# Patient Record
Sex: Male | Born: 1965 | Race: Black or African American | Hispanic: No | Marital: Married | Smoking: Never smoker
Health system: Southern US, Community
[De-identification: ages and names within clinical notes are randomized; demographics above are authoritative.]

## PROBLEM LIST (undated history)

## (undated) DIAGNOSIS — A630 Anogenital (venereal) warts: Secondary | ICD-10-CM

## (undated) DIAGNOSIS — K648 Other hemorrhoids: Secondary | ICD-10-CM

## (undated) DIAGNOSIS — S93101A Unspecified subluxation of right toe(s), initial encounter: Secondary | ICD-10-CM

## (undated) HISTORY — DX: Unspecified subluxation of right toe(s), initial encounter: S93.101A

## (undated) HISTORY — DX: Anogenital (venereal) warts: A63.0

## (undated) HISTORY — DX: Other hemorrhoids: K64.8

---

## 1998-02-24 ENCOUNTER — Emergency Department (HOSPITAL_COMMUNITY): Admission: EM | Admit: 1998-02-24 | Discharge: 1998-02-24 | Payer: Self-pay | Admitting: Emergency Medicine

## 2001-08-01 ENCOUNTER — Emergency Department (HOSPITAL_COMMUNITY): Admission: EM | Admit: 2001-08-01 | Discharge: 2001-08-01 | Payer: Self-pay | Admitting: *Deleted

## 2004-04-19 ENCOUNTER — Ambulatory Visit (HOSPITAL_COMMUNITY): Admission: RE | Admit: 2004-04-19 | Discharge: 2004-04-19 | Payer: Self-pay | Admitting: Family Medicine

## 2006-11-27 ENCOUNTER — Ambulatory Visit: Payer: Self-pay | Admitting: Family Medicine

## 2006-12-13 ENCOUNTER — Ambulatory Visit: Payer: Self-pay | Admitting: *Deleted

## 2007-07-10 DIAGNOSIS — Z8719 Personal history of other diseases of the digestive system: Secondary | ICD-10-CM

## 2007-07-10 DIAGNOSIS — A630 Anogenital (venereal) warts: Secondary | ICD-10-CM

## 2007-07-18 ENCOUNTER — Telehealth (INDEPENDENT_AMBULATORY_CARE_PROVIDER_SITE_OTHER): Payer: Self-pay | Admitting: *Deleted

## 2007-07-18 ENCOUNTER — Encounter (INDEPENDENT_AMBULATORY_CARE_PROVIDER_SITE_OTHER): Payer: Self-pay | Admitting: *Deleted

## 2007-07-25 ENCOUNTER — Ambulatory Visit: Payer: Self-pay | Admitting: Family Medicine

## 2007-07-25 DIAGNOSIS — M21619 Bunion of unspecified foot: Secondary | ICD-10-CM

## 2007-08-02 ENCOUNTER — Encounter (INDEPENDENT_AMBULATORY_CARE_PROVIDER_SITE_OTHER): Payer: Self-pay | Admitting: *Deleted

## 2008-01-03 ENCOUNTER — Ambulatory Visit (HOSPITAL_COMMUNITY): Admission: RE | Admit: 2008-01-03 | Discharge: 2008-01-03 | Payer: Self-pay | Admitting: Family Medicine

## 2008-01-10 ENCOUNTER — Telehealth (INDEPENDENT_AMBULATORY_CARE_PROVIDER_SITE_OTHER): Payer: Self-pay | Admitting: *Deleted

## 2008-01-11 ENCOUNTER — Ambulatory Visit: Payer: Self-pay | Admitting: Internal Medicine

## 2008-01-17 ENCOUNTER — Encounter: Admission: RE | Admit: 2008-01-17 | Discharge: 2008-01-17 | Payer: Self-pay | Admitting: Internal Medicine

## 2010-11-21 ENCOUNTER — Encounter: Payer: Self-pay | Admitting: Internal Medicine

## 2012-04-23 ENCOUNTER — Encounter: Payer: Self-pay | Admitting: Family Medicine

## 2012-04-23 ENCOUNTER — Ambulatory Visit (INDEPENDENT_AMBULATORY_CARE_PROVIDER_SITE_OTHER): Payer: Self-pay | Admitting: Family Medicine

## 2012-04-23 VITALS — BP 116/74 | HR 57 | Ht 69.5 in | Wt 186.0 lb

## 2012-04-23 DIAGNOSIS — M25559 Pain in unspecified hip: Secondary | ICD-10-CM

## 2012-04-23 DIAGNOSIS — S93103A Unspecified subluxation of unspecified toe(s), initial encounter: Secondary | ICD-10-CM | POA: Insufficient documentation

## 2012-04-23 MED ORDER — MELOXICAM 15 MG PO TABS: 15.0000 mg | ORAL_TABLET | Freq: Every day | ORAL | Status: AC

## 2012-04-23 NOTE — Patient Instructions (Addendum)
Thank you for coming in today, it was nice to meet you I am going to give you a prescription for a medication to help with inflammation.  Do not take ibuprofen, aleve, goody/bc powders with this I want to get an X-ray of your hip. Be sure to ice after every practice and game Stretch before and after each practice and game. I will send a referral over to sports medicine for you to be seen there.

## 2012-04-30 NOTE — Progress Notes (Signed)
  Subjective:    Patient ID: Adam Grimes, male    DOB: 1966-01-12, 46 y.o.   MRN: [id number]  HPI Here as new patient  1. Hip pain:  Patient with R hip pain off and on over the past year.  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  States that he needs to get this problem fixed because he is playing with President Obama in a few months.  Describes pain as sharp pain that is worse after he finishes playing or if he plays for a long time.  Pain located on upper lateral aspect of hip area.  Denies snapping/popping of hip, pain at rest.  2. Toe pain:  Has had problem with 5th toe on R foot, "popping in and out" over the past few years.  He brings previous x-rays with him today.  Has been told in the past that he may need surgery.  Thinks his hip pain may be from trying to compensate for his toe problem.    Review of Systems     Objective:   Physical Exam  Constitutional: He appears well-developed and well-nourished. No distress.  Musculoskeletal: He exhibits no edema.       5th toe of R foot with chronic subluxation, easily reducible but subluxes right back.  No tenderness along metatarsal.  Strength 5/5 with inversion/eversion.    Hip:  FROM.  Strength: Flexion 5/5, abduction/adduction 5/5.  No tenderness along greater trochanter.  Pain not reproducible with palpation.            Assessment & Plan:

## 2012-04-30 NOTE — Assessment & Plan Note (Addendum)
Chronic subluxation.  Xrays reviewed.  Advised that ultimate treatment would be surgery but he would lose mobility in that toe.  I think that he would benefit from orthotics or sport insoles with buildups to better support that side of the foot, will refer to sports medicine.

## 2012-04-30 NOTE — Assessment & Plan Note (Addendum)
Likely related to muscle overuse/strain.  Advised proper stretching before and after and icing after practice/game.  Will get films of hip to assess for possible stress fx.  Does not appear to be bursitis. Mobic for pain control for now.  Will also get a referral to SM.

## 2012-05-01 ENCOUNTER — Encounter: Payer: Self-pay | Admitting: Family Medicine

## 2012-10-29 ENCOUNTER — Ambulatory Visit: Payer: Self-pay | Admitting: Family Medicine

## 2013-02-11 ENCOUNTER — Ambulatory Visit (HOSPITAL_COMMUNITY)
Admission: RE | Admit: 2013-02-11 | Discharge: 2013-02-11 | Disposition: A | Discharge status: Home / Self Care | Payer: Self-pay | Source: Ambulatory Visit | Attending: Family Medicine | Admitting: Family Medicine

## 2013-02-11 ENCOUNTER — Telehealth: Payer: Self-pay | Admitting: Family Medicine

## 2013-02-11 DIAGNOSIS — M25559 Pain in unspecified hip: Secondary | ICD-10-CM

## 2013-02-11 DIAGNOSIS — M169 Osteoarthritis of hip, unspecified: Secondary | ICD-10-CM | POA: Insufficient documentation

## 2013-02-11 DIAGNOSIS — M1610 Unilateral primary osteoarthritis, unspecified hip: Secondary | ICD-10-CM | POA: Insufficient documentation

## 2013-02-11 NOTE — Telephone Encounter (Signed)
Informed pt that he can go over to hospital to get xray of hip and then can call sports medication and make appt at his convenience.  Pt is aware and will get this done.  States that he is in season and his hip is starting to hurt again.

## 2013-02-11 NOTE — Telephone Encounter (Signed)
Patient was last seen by Dr. Ashley Royalty June of last year.  He is calling because he was supposed to have a referral to Sports Medicine and he was supposed to have an order for an Xray.  He has some paperwork but he didn't know where he was supposed to go or when he was supposed to go for any of this.

## 2013-12-03 ENCOUNTER — Ambulatory Visit (INDEPENDENT_AMBULATORY_CARE_PROVIDER_SITE_OTHER): Payer: No Typology Code available for payment source | Admitting: Podiatry

## 2013-12-03 ENCOUNTER — Encounter: Payer: Self-pay | Admitting: Podiatry

## 2013-12-03 ENCOUNTER — Ambulatory Visit: Payer: No Typology Code available for payment source

## 2013-12-03 VITALS — BP 80/60 | HR 72 | Resp 16 | Ht 71.0 in | Wt 190.0 lb

## 2013-12-03 DIAGNOSIS — M79673 Pain in unspecified foot: Secondary | ICD-10-CM

## 2013-12-03 DIAGNOSIS — M79609 Pain in unspecified limb: Secondary | ICD-10-CM

## 2013-12-03 DIAGNOSIS — M7751 Other enthesopathy of right foot: Secondary | ICD-10-CM

## 2013-12-03 DIAGNOSIS — M7750 Other enthesopathy of unspecified foot: Secondary | ICD-10-CM

## 2013-12-03 NOTE — Progress Notes (Signed)
   Subjective:    Patient ID: Adam Grimes, male    DOB: [birth year], 48 y.o.   MRN: [id number]  HPI Comments: Right foot pinky toe, and calluses on the bottom of foot , extremely painful . Plantar foot 4/ 5th  Met .     Review of Systems  All other systems reviewed and are negative.       Objective:   Physical Exam: I have reviewed his past medical history medications allergies surgeries social history. Vital signs are stable he is alert and oriented x3. Pulses are strongly palpable bilateral. Neurologic sensorium is intact per Semmes-Weinstein monofilament. Deep tendon reflexes are intact bilateral muscle strength +5 over 5 dorsiflexors plantar flexors inverters everters all intrinsic musculature is intact. Orthopedic evaluation demonstrates all joints distal to the ankle have a full range of motion without crepitation. He has a dorsal medial dislocation of the fifth metatarsophalangeal joint and fifth digit of the right foot. More than likely this is secondary to a tear in the plantar plate. He also has reactive hyperkeratosis to the plantar aspect of the fourth and fifth metatarsophalangeal joint of the right foot. A soft tissue increase in density on radiograph leads [place] to believe the nonpulsatile soft tissue mass of the plantar aspect of the fifth metatarsal right foot is more than likely bursitis. He has a wide excursion angle of the fourth intermetatarsal space. The [place] a Taylor's bunion deformity. Porokeratosis are also noted plantar aspect of the foot.        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 Bursitis and capsulitis sub-fifth metatarsophalangeal joint right foot. 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  Plan we discussed the etiology pathology conservative versus surgical therapies. At this point I excised the porokeratosis plantar aspect of the right foot. I also injected Kenalog and  local anesthetic which alleviated his symptoms beneath the fifth metatarsal head of the right foot he immediately. I will followup with him in one month at which time we will discuss surgical intervention.

## 2013-12-24 ENCOUNTER — Ambulatory Visit: Payer: No Typology Code available for payment source | Admitting: Podiatry

## 2013-12-26 ENCOUNTER — Ambulatory Visit: Payer: Self-pay

## 2014-01-02 ENCOUNTER — Encounter: Payer: Self-pay | Admitting: Podiatry

## 2014-01-02 ENCOUNTER — Ambulatory Visit: Payer: Self-pay

## 2014-01-02 ENCOUNTER — Ambulatory Visit: Payer: No Typology Code available for payment source | Admitting: Podiatry

## 2014-01-02 VITALS — BP 158/94 | HR 64 | Resp 12

## 2014-01-02 DIAGNOSIS — M7750 Other enthesopathy of unspecified foot: Secondary | ICD-10-CM

## 2014-01-02 DIAGNOSIS — M79609 Pain in unspecified limb: Secondary | ICD-10-CM

## 2014-01-02 DIAGNOSIS — M216X9 Other acquired deformities of unspecified foot: Secondary | ICD-10-CM

## 2014-01-02 DIAGNOSIS — M7751 Other enthesopathy of right foot: Secondary | ICD-10-CM

## 2014-01-02 DIAGNOSIS — M79673 Pain in unspecified foot: Secondary | ICD-10-CM

## 2014-01-02 NOTE — Progress Notes (Signed)
He presents today stating that the injection that I provided him periarticularly about the fifth metatarsophalangeal joint has worked wonders. He was still like to consider surgical intervention or other conservative therapies.  Objective: Vital signs are stable he is alert and oriented x3. Pulses are palpable right foot. Mild tenderness on palpation of the fifth metatarsophalangeal joint of the right foot.  Assessment: Plantar flexed fifth metatarsal resulting in area of reactive hyperkeratosis with Taylor's bunion deformity right foot.  Plan: Discussed etiology pathology conservative versus surgical therapies at this point we will ahead and skin him for orthotics and I will followup with him once those come in.

## 2014-06-18 ENCOUNTER — Ambulatory Visit: Payer: Self-pay

## 2014-09-10 ENCOUNTER — Encounter: Payer: Self-pay | Admitting: Family Medicine

## 2014-09-10 ENCOUNTER — Ambulatory Visit (INDEPENDENT_AMBULATORY_CARE_PROVIDER_SITE_OTHER): Payer: Self-pay | Admitting: Family Medicine

## 2014-09-10 VITALS — BP 153/91 | HR 63 | Temp 98.3°F | Ht 71.0 in | Wt 188.5 lb

## 2014-09-10 DIAGNOSIS — M25551 Pain in right hip: Secondary | ICD-10-CM

## 2014-09-10 DIAGNOSIS — Z8719 Personal history of other diseases of the digestive system: Secondary | ICD-10-CM

## 2014-09-10 DIAGNOSIS — S93101D Unspecified subluxation of right toe(s), subsequent encounter: Secondary | ICD-10-CM

## 2014-09-10 DIAGNOSIS — R030 Elevated blood-pressure reading, without diagnosis of hypertension: Secondary | ICD-10-CM

## 2014-09-10 DIAGNOSIS — IMO0001 Reserved for inherently not codable concepts without codable children: Secondary | ICD-10-CM

## 2014-09-10 LAB — COMPREHENSIVE METABOLIC PANEL
ALK PHOS: 74 U/L (ref 39–117)
ALT: 18 U/L (ref 0–53)
AST: 21 U/L (ref 0–37)
Albumin: 4.2 g/dL (ref 3.5–5.2)
BILIRUBIN TOTAL: 0.9 mg/dL (ref 0.2–1.2)
BUN: 13 mg/dL (ref 6–23)
CO2: 29 meq/L (ref 19–32)
Calcium: 9.5 mg/dL (ref 8.4–10.5)
Chloride: 101 mEq/L (ref 96–112)
Creat: 1.16 mg/dL (ref 0.50–1.35)
GLUCOSE: 119 mg/dL — AB (ref 70–99)
POTASSIUM: 4 meq/L (ref 3.5–5.3)
SODIUM: 138 meq/L (ref 135–145)
Total Protein: 6.4 g/dL (ref 6.0–8.3)

## 2014-09-10 LAB — CBC
HEMATOCRIT: 36.3 % — AB (ref 39.0–52.0)
Hemoglobin: 12.6 g/dL — ABNORMAL LOW (ref 13.0–17.0)
MCH: 30.1 pg (ref 26.0–34.0)
MCHC: 34.7 g/dL (ref 30.0–36.0)
MCV: 86.6 fL (ref 78.0–100.0)
PLATELETS: 202 10*3/uL (ref 150–400)
RBC: 4.19 MIL/uL — AB (ref 4.22–5.81)
RDW: 14.1 % (ref 11.5–15.5)
WBC: 5.3 10*3/uL (ref 4.0–10.5)

## 2014-09-10 LAB — LIPID PANEL
Cholesterol: 145 mg/dL (ref 0–200)
HDL: 56 mg/dL (ref >39)
LDL Cholesterol: 70 mg/dL (ref 0–99)
TRIGLYCERIDES: 96 mg/dL (ref <150)
Total CHOL/HDL Ratio: 2.6 Ratio
VLDL: 19 mg/dL (ref 0–40)

## 2014-09-10 NOTE — Patient Instructions (Signed)
Dear Adam Grimes, Thank you for coming in to clinic today.  1. For your Foot pain - please call Dr. Stephenie Acres office and discuss plans to follow-up and schedule surgery as needed. 2. For your Hip pain - you will be called by Sports Medicine with an appointment. Your X-ray shows mild Arthritis. Recommend stretching before activity, ice after activity, and heat as needed. May try Tylenol or Advil as tolerated. 3. Checked blood work today - to discuss results at future appointment.  Some important numbers from today's visit: BP - 153/91 - elevated BP, we will check again in the future. Please go to a [facility] or drug store and check your BP and bring me the readings.  Please schedule a follow-up appointment with Dr. Parks Ranger in 1-3 months, for follow-up.  If you have any other questions or concerns, please feel free to call the clinic to contact me. You may also schedule an earlier appointment if necessary.  However, if your symptoms get significantly worse, please go to the Emergency Department to seek immediate medical attention.  Adam Grimes, [place]

## 2014-09-10 NOTE — Assessment & Plan Note (Signed)
Hx prior rectal bleeding due to ASA - Last work-up double barium enema (negative, 03/2004) - reportedly tolerates Ibuprofen  Plan: 1. Order CBC 2. Monitor with Ibuprofen / Tylenol use 3. RTC if significant worsening

## 2014-09-10 NOTE — Assessment & Plan Note (Addendum)
Elevated BP, previous similarly elevated BP >150/90 in 12/2013 - No prior anti-HTN treatment, +family hx  Improved BP on re-check 144/84  Plan: 1. No treatment at this time 2. Advised monitor BP outpatient, reduce risk factors, smoking cessation, remain active, dietary dec Na and inc K vegetables 3. Check CMET 4. RTC 1 month, re-check BP, consider future anti-HTN agent if remains elevated

## 2014-09-10 NOTE — Assessment & Plan Note (Signed)
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 Milinda Pointer, Podiatry - improved s/p injection, previously recommended surgery, no recent f/u - Last X-ray 12/2013  Plan: 1. Recommend contacting Podiatry office to discuss previous surgical plans, and notify [facility] if repeat referral needed 2. Continue conservative therapy, may take NSAIDs PRN, relative rest

## 2014-09-10 NOTE — Progress Notes (Signed)
   Subjective:    Patient ID: Adam Grimes, male    DOB: May 29, 1966, 48 y.o.   MRN: [id number]  Patient presents for annual physical.  HPI  Right Foot pain, chronic: - History of initial sports related injury 2006, intermittent course with pain since injury. Previously followed by Podiatry [facility]), Dr. Milinda Pointer 12/2013 and 12/2013 had injection with significant improvement (up to 1 month), other work-up including X-ray (12/2013) showing dislocation of 5th MTP joint with possible plantar plate rupture. - Worse with prolonged activity, playing basketball - Does not take any medicines for this problem, no other conservative therapy tried - Previously referred to [facility], however has not followed-up. Hx of no ins prior, however now has [employer] - Denies swelling, redness, worsening pain, numbness, tingling, weakness  Right Hip Pain, chronic: - Chronic problem for about 3 years, suspects related to compensating due to Right foot pain - Describes sharp pain radiating down Right leg, intermittently - Occasionally takes Advil prior to basketball games, with good relief - Last Imaging R-hip (01/2013) - mild osteoarthritis  Hx Rectal Bleeding - Reported to have episodes of rectal bleeding after ASA use. 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 Normal work of breathing. Abd - soft, NTND, no masses, +active BS MSK - Right Hip - no significant tenderness on palpation or compression, active ROM intact Ext - Right Foot: plantar and lateral aspect mildly tender to palpation, no mid-foot or ankle tenderness, no edema or erythema, non-tender, no edema, peripheral pulses intact +2 b/l Neuro - awake, alert, oriented, grossly non-focal, intact muscle strength 5/5 b/l grip, ankles, toes, intact distal sensation to light touch, gait normal    Assessment & Plan:   See specific A&P problem list for details.

## 2014-09-10 NOTE — Assessment & Plan Note (Signed)
Chronic recurrent Right hip pain, suspected due to MSK injury from overuse and compensation due to Right foot pain - Previously referred to [facility] without follow-up - Inadequate conservative treatment  Plan: 1. Recommend NSAIDs, ice/heat PRN, stretching, relative rest 2. Referral to [facility]

## 2014-09-11 ENCOUNTER — Encounter: Payer: Self-pay | Admitting: Family Medicine

## 2014-09-22 ENCOUNTER — Ambulatory Visit: Payer: Self-pay | Admitting: Sports Medicine

## 2014-10-07 ENCOUNTER — Other Ambulatory Visit: Payer: Self-pay | Admitting: Podiatry

## 2014-10-07 ENCOUNTER — Ambulatory Visit (INDEPENDENT_AMBULATORY_CARE_PROVIDER_SITE_OTHER): Payer: Self-pay

## 2014-10-07 ENCOUNTER — Encounter: Payer: Self-pay | Admitting: Podiatry

## 2014-10-07 ENCOUNTER — Ambulatory Visit (INDEPENDENT_AMBULATORY_CARE_PROVIDER_SITE_OTHER): Payer: Self-pay | Admitting: Podiatry

## 2014-10-07 VITALS — BP 138/90 | HR 63 | Resp 18

## 2014-10-07 DIAGNOSIS — M778 Other enthesopathies, not elsewhere classified: Secondary | ICD-10-CM

## 2014-10-07 DIAGNOSIS — M779 Enthesopathy, unspecified: Secondary | ICD-10-CM

## 2014-10-07 DIAGNOSIS — M21621 Bunionette of right foot: Secondary | ICD-10-CM

## 2014-10-07 DIAGNOSIS — M7752 Other enthesopathy of left foot: Secondary | ICD-10-CM

## 2014-10-07 DIAGNOSIS — M205X1 Other deformities of toe(s) (acquired), right foot: Secondary | ICD-10-CM

## 2014-10-07 DIAGNOSIS — S82402A Unspecified fracture of shaft of left fibula, initial encounter for closed fracture: Secondary | ICD-10-CM

## 2014-10-07 NOTE — Progress Notes (Signed)
Adam Grimes presents today for a discussion regarding his painful fifth metatarsophalangeal joint of his right foot is also complaining of pain in his left ankle. States that the pain to the left ankle has started several weeks ago and seems to be getting worse with swelling particularly she's been ambulating. He also states that his right foot along the fifth metatarsophalangeal joint area is ever increasingly becoming more more painful limiting him from his activities and is daily duties. He denies any further trauma to the area. No change in his past medical history medications allergies surgeries or social history.  Objective: Vital signs are stable he is alert and oriented 3. Pulses are strongly palpable bilateral. He has pain on palpation left ankle. Radiographic evaluation demonstrates a hairline fracture of the fibular malleolus soft tissue margins appear to be normal. He has a painful fifth metatarsophalangeal joint and hammertoe deformity that is present that is result of moderate to severe trauma with dislocation of the toe. I reviewed the radiographs today demonstrate a tailor's bunion deformity and dorsally dislocated fifth toe.  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 Fractured lateral malleolus left foot. Nondisplaced non-comminuted and healing.  Plan: Continue to wrap the left foot and stay off of it as much as possible. The right foot was discussed today regarding a surgical consult which consists of a fifth metatarsal osteotomy and hammertoe repair with possible pin and a skin plasty. I went over the consent form today with him line by line and number number getting him ample time to ask questions his office regarding fifth metatarsal osteotomy with screw hammertoe repair with pin and the skin plasty. I answered all questions to the best of my ability in layman's terms. We did discuss the possible postop complications which may include but are not limited to  postoperative pain bleeding swelling infection need for further surgery loss of digit possibly a loss of life loss of ability to play sports. He understands this and is amenable to it. He signed all 3 pages of the consent form and I will follow-up with him in the near future for surgical intervention.

## 2014-10-07 NOTE — Patient Instructions (Signed)
Pre-Operative Instructions  Congratulations, you have decided to take an important step to improving your quality of life.  You can be assured that the doctors of [facility] will be with you every step of the way.  1. Plan to be at the surgery center/hospital at least 1 (one) hour prior to your scheduled time unless otherwise directed by the surgical center/hospital staff.  You must have a responsible adult accompany you, remain during the surgery and drive you home.  Make sure you have directions to the surgical center/hospital and know how to get there on time. 2. For hospital based surgery you will need to obtain a history and physical form from your family physician within 1 month prior to the date of surgery- we will give you a form for you primary physician.  3. We make every effort to accommodate the date you request for surgery.  There are however, times where surgery dates or times have to be moved.  We will contact you as soon as possible if a change in schedule is required.   4. No Aspirin/Ibuprofen for one week before surgery.  If you are on aspirin, any non-steroidal anti-inflammatory medications (Mobic, Aleve, Ibuprofen) you should stop taking it 7 days prior to your surgery.  You make take Tylenol  For pain prior to surgery.  5. Medications- If you are taking daily heart and blood pressure medications, seizure, reflux, allergy, asthma, anxiety, pain or diabetes medications, make sure the surgery center/hospital is aware before the day of surgery so they may notify you which medications to take or avoid the day of surgery. 6. No food or drink after midnight the night before surgery unless directed otherwise by surgical center/hospital staff. 7. No alcoholic beverages 24 hours prior to surgery.  No smoking 24 hours prior to or 24 hours after surgery. 8. Wear loose pants or shorts- loose enough to fit over bandages, boots, and casts. 9. No slip on shoes, sneakers are best. 10. Bring  your boot with you to the surgery center/hospital.  Also bring crutches or a walker if your physician has prescribed it for you.  If you do not have this equipment, it will be provided for you after surgery. 11. If you have not been contracted by the surgery center/hospital by the day before your surgery, call to confirm the date and time of your surgery. 12. Leave-time from work may vary depending on the type of surgery you have.  Appropriate arrangements should be made prior to surgery with your employer. 13. Prescriptions will be provided immediately following surgery by your doctor.  Have these filled as soon as possible after surgery and take the medication as directed. 14. Remove nail polish on the operative foot. 15. Wash the night before surgery.  The night before surgery wash the foot and leg well with the antibacterial soap provided and water paying special attention to beneath the toenails and in between the toes.  Rinse thoroughly with water and dry well with a towel.  Perform this wash unless told not to do so by your physician.  Enclosed: 1 Ice pack (please put in freezer the night before surgery)   1 Hibiclens skin cleaner   Pre-op Instructions  If you have any questions regarding the instructions, do not hesitate to call our office.  [place]: [address] [place], [place] [address] [phone]  [place]: [address] [place], [place] [address] [phone]  [place]: [address]  [place], [place] [address] [phone]  Dr. Richard   Tuchman DPM, Dr. Norman Regal DPM Dr. Richard Sikora DPM, Dr. M. Todd Hyatt DPM, Dr. Kathryn Egerton DPM 

## 2014-10-14 ENCOUNTER — Telehealth: Payer: Self-pay | Admitting: *Deleted

## 2014-10-14 NOTE — Telephone Encounter (Signed)
I called the patient back.  I informed him we are going to have to cancel the surgery at [facility] because they are not part of the [facility] and financial arrangement does not apply there.  Dr. Milinda Pointer only does surgeries at [facility], he doesn't do them at [facility].  He wants to refer you to Dr. Jacqualyn Posey to do the procedure.  "I don't want another doctor doing my procedure.  I want Dr. Milinda Pointer, I know of his work, he did work on my son's feet.  I don't know anything about this other doctor."  Dr. Milinda Pointer would not recommend Dr. Jacqualyn Posey if he felt he was not capable of doing a good job.  He wants you to come in for an appointment to have a consultation with Dr. Jacqualyn Posey.  If you choose to have the procedure done by Dr. Milinda Pointer, you will be responsible for the facility fee as well as anesthesia out of your pocket.  The charge for Dr. Milinda Pointer is $2,157.50.  That will have to be paid prior to surgery.  Would you like to schedule an appointment with Dr. Jacqualyn Posey?  "Go ahead and transfer me to a scheduler.  Jocelyn Lamer spoke to the patient about his estimate and what is expected of him prior to surgery.  She also explained to him that the facility fee and anesthesia fee may be expected prior to surgery as well.  It could cost up to $6000 out of pocket.

## 2014-10-14 NOTE — Telephone Encounter (Signed)
Pt called states he has surgery scheduled 10/20/2014, and no other message.

## 2014-10-14 NOTE — Telephone Encounter (Signed)
I called and left the patient a message to call me in regards to surgery cancellation for 10/20/2014.  I need to inform him that we will have to reschedule surgery to [facility], [facility] is not a part of [facility] so 50% discount will not apply.  Dr. Milinda Pointer doesn't do surgeries there so we are going to refer him to Dr. Jacqualyn Posey.  He will have to schedule an appointment for a consultation with Dr. Jacqualyn Posey. He will have to pay 50% up front before surgery date.  That cost per Jocelyn Lamer will be $2,157.50.

## 2016-02-17 DIAGNOSIS — Z021 Encounter for pre-employment examination: Secondary | ICD-10-CM | POA: Insufficient documentation

## 2016-02-17 DIAGNOSIS — Z7689 Persons encountering health services in other specified circumstances: Secondary | ICD-10-CM | POA: Insufficient documentation

## 2016-09-13 ENCOUNTER — Ambulatory Visit (INDEPENDENT_AMBULATORY_CARE_PROVIDER_SITE_OTHER): Payer: Managed Care, Other (non HMO) | Admitting: Podiatry

## 2016-09-13 ENCOUNTER — Ambulatory Visit: Payer: Managed Care, Other (non HMO)

## 2016-09-13 ENCOUNTER — Ambulatory Visit (INDEPENDENT_AMBULATORY_CARE_PROVIDER_SITE_OTHER): Payer: Managed Care, Other (non HMO)

## 2016-09-13 ENCOUNTER — Telehealth: Payer: Self-pay | Admitting: *Deleted

## 2016-09-13 ENCOUNTER — Encounter: Payer: Self-pay | Admitting: Podiatry

## 2016-09-13 ENCOUNTER — Encounter: Payer: Self-pay | Admitting: *Deleted

## 2016-09-13 DIAGNOSIS — M79672 Pain in left foot: Secondary | ICD-10-CM

## 2016-09-13 DIAGNOSIS — M21621 Bunionette of right foot: Secondary | ICD-10-CM

## 2016-09-13 DIAGNOSIS — Q828 Other specified congenital malformations of skin: Secondary | ICD-10-CM | POA: Diagnosis not present

## 2016-09-13 DIAGNOSIS — M7752 Other enthesopathy of left foot: Secondary | ICD-10-CM

## 2016-09-13 DIAGNOSIS — M6740 Ganglion, unspecified site: Secondary | ICD-10-CM | POA: Diagnosis not present

## 2016-09-13 NOTE — Progress Notes (Signed)
He presents today chief complaint of painful fifth metatarsophalangeal joint dislocation of the toe right foot he states that this toe still bothers me and I think him ready to have something done about it he is also concerned about 2 nodules that have popped up on the anterior and anterolateral ankle left he states the base, but your ago and he's noticed that they still swell and get larger and more painful and go down.  Objective: Vital signs are stable he is alert and oriented 3. Pulses are palpable. Neurologic system is intact to tendon reflexes are intact muscle strength is normal bilateral. He has 2 large nonpulsatile masses is set on the lateral aspect of the ankle overlying subtalar joint and the anterior medial ankle left. These appear to be ganglion cysts and no calcification is noted on radiography. Right foot does demonstrate complete dislocation of the fifth toe on the right foot with a reactive hyperkeratosis sub-fifth metatarsal head. The fifth toes completely dislocated and sitting dorsally.  Assessment: Dislocation and prominent fifth metatarsal head of the right foot. Painful porokeratosis to the plantar aspect of the fourth metatarsal right foot. 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 Once our report has come back for this and we will consider an a consent form for a fifth metatarsal head resection and excision of soft tissue lesion plantar fourth. He can only be out of work for maximum 3 weeks.

## 2016-09-24 ENCOUNTER — Inpatient Hospital Stay: Admission: RE | Admit: 2016-09-24 | Payer: Self-pay | Source: Ambulatory Visit

## 2016-10-14 NOTE — Telephone Encounter (Addendum)
Pt states he needs to reschedule his MRI. Left message informing pt he could call [facility] and reschedule his appt and if he had concerns with insurance to call our office again. 11/14/2016-Received fax request for Lidocaine 5% apply 2 grams upt to 4 times daily to affected areas +3 refills. 11/15/2016-Dr. Hyatt okayed Lidocaine 5% as on fax form +3refills

## 2016-10-28 ENCOUNTER — Ambulatory Visit
Admission: RE | Admit: 2016-10-28 | Discharge: 2016-10-28 | Disposition: A | Discharge status: Home / Self Care | Payer: Managed Care, Other (non HMO) | Source: Ambulatory Visit | Attending: Podiatry | Admitting: Podiatry

## 2016-10-28 DIAGNOSIS — M6740 Ganglion, unspecified site: Secondary | ICD-10-CM

## 2016-10-28 MED ORDER — GADOBENATE DIMEGLUMINE 529 MG/ML IV SOLN
18.0000 mL | Freq: Once | INTRAVENOUS | Status: AC | PRN
  Administered 2016-10-28: 18 mL via INTRAVENOUS

## 2016-11-08 ENCOUNTER — Encounter: Payer: Self-pay | Admitting: Podiatry

## 2016-11-08 ENCOUNTER — Ambulatory Visit (INDEPENDENT_AMBULATORY_CARE_PROVIDER_SITE_OTHER): Payer: Managed Care, Other (non HMO) | Admitting: Podiatry

## 2016-11-08 DIAGNOSIS — M21621 Bunionette of right foot: Secondary | ICD-10-CM | POA: Diagnosis not present

## 2016-11-08 DIAGNOSIS — Q828 Other specified congenital malformations of skin: Secondary | ICD-10-CM | POA: Diagnosis not present

## 2016-11-08 NOTE — Patient Instructions (Signed)
Pre-Operative Instructions  Congratulations, you have decided to take an important step to improving your quality of life.  You can be assured that the doctors of [facility] will be with you every step of the way.  1. Plan to be at the surgery center/hospital at least 1 (one) hour prior to your scheduled time unless otherwise directed by the surgical center/hospital staff.  You must have a responsible adult accompany you, remain during the surgery and drive you home.  Make sure you have directions to the surgical center/hospital and know how to get there on time. 2. For hospital based surgery you will need to obtain a history and physical form from your family physician within 1 month prior to the date of surgery- we will give you a form for you primary physician.  3. We make every effort to accommodate the date you request for surgery.  There are however, times where surgery dates or times have to be moved.  We will contact you as soon as possible if a change in schedule is required.   4. No Aspirin/Ibuprofen for one week before surgery.  If you are on aspirin, any non-steroidal anti-inflammatory medications (Mobic, Aleve, Ibuprofen) you should stop taking it 7 days prior to your surgery.  You make take Tylenol  For pain prior to surgery.  5. Medications- If you are taking daily heart and blood pressure medications, seizure, reflux, allergy, asthma, anxiety, pain or diabetes medications, make sure the surgery center/hospital is aware before the day of surgery so they may notify you which medications to take or avoid the day of surgery. 6. No food or drink after midnight the night before surgery unless directed otherwise by surgical center/hospital staff. 7. No alcoholic beverages 24 hours prior to surgery.  No smoking 24 hours prior to or 24 hours after surgery. 8. Wear loose pants or shorts- loose enough to fit over bandages, boots, and casts. 9. No slip on shoes, sneakers are best. 10. Bring  your boot with you to the surgery center/hospital.  Also bring crutches or a walker if your physician has prescribed it for you.  If you do not have this equipment, it will be provided for you after surgery. 11. If you have not been contracted by the surgery center/hospital by the day before your surgery, call to confirm the date and time of your surgery. 12. Leave-time from work may vary depending on the type of surgery you have.  Appropriate arrangements should be made prior to surgery with your employer. 13. Prescriptions will be provided immediately following surgery by your doctor.  Have these filled as soon as possible after surgery and take the medication as directed. 14. Remove nail polish on the operative foot. 15. Wash the night before surgery.  The night before surgery wash the foot and leg well with the antibacterial soap provided and water paying special attention to beneath the toenails and in between the toes.  Rinse thoroughly with water and dry well with a towel.  Perform this wash unless told not to do so by your physician.  Enclosed: 1 Ice pack (please put in freezer the night before surgery)   1 Hibiclens skin cleaner   Pre-op Instructions  If you have any questions regarding the instructions, do not hesitate to call our office.  [place]: [address] [place], [place] [address] [phone]  [place]: [address] [place], [place] [address] [phone]  [place]: [address]  [place], [place] [address] [phone]   Dr.   Norman Regal DPM, Dr. Matthew Wagoner DPM, Dr. M. Todd Blakely Maranan DPM, Dr. Titorya Stover DPM 

## 2016-11-08 NOTE — Progress Notes (Signed)
He presents today sinus consent for his right foot. His MRI is back for his left ankle. He states that his left ankle has absolutely no pain whatsoever and really does not want to do anything to the left foot at all he would like his right fifth metatarsal area.  Objective: Vital signs are stable alert and oriented 3. Pulses are palpable. Neurologic sensorium is intact. Palpable mass left ankle MRI relates ganglion cyst. Right foot demonstrates dislocated fifth metatarsophalangeal joint and a painful lesion sub-fourth metatarsal phalangeal joint and a contracted fourth metatarsophalangeal joint. Radiographs were reviewed today.  Assessment: At this point I recommended surgical resection of the head of the fifth metatarsal with excision of soft tissue lesion plantar aspect of the right foot. Release of the fourth metatarsophalangeal joint was also be necessary.  Plan: We discussed etiology and pathology conservative versus surgical therapies at this point I have recommended resection of the head of the fifth metatarsal right foot. And release of the fourth metatarsophalangeal joint with resection of soft tissue lesion. He understands this is uncomfortable to inside of the pages of the consent form. We did discuss the possible postoperative complications which may include but are not limited to postop pain bleeding swelling infection recurrence need further surgery overcorrection under correction loss of digit also limb loss of life continued pain. He understands this is amenable to it dispensed a cam walker today I will follow-up with him in years future for surgical intervention we discussed anesthesia and the surgery center today.

## 2016-11-14 NOTE — Telephone Encounter (Signed)
That will be fine if you want to Rx it.

## 2016-11-15 MED ORDER — LIDOCAINE 5 % EX OINT: TOPICAL_OINTMENT | CUTANEOUS | 3 refills | Status: DC

## 2017-02-21 ENCOUNTER — Telehealth: Payer: Self-pay | Admitting: *Deleted

## 2017-02-21 NOTE — Telephone Encounter (Signed)
Received request for refill of omega 3. Dr. Milinda Pointer states he did not order for pt. Return fax denied.

## 2017-09-28 ENCOUNTER — Telehealth: Payer: Self-pay | Admitting: *Deleted

## 2017-09-28 MED ORDER — LIDOCAINE 5 % EX OINT: TOPICAL_OINTMENT | CUTANEOUS | 3 refills | Status: DC

## 2017-09-28 NOTE — Telephone Encounter (Signed)
Refill request for Lidocaine 5% Ointment. Dr. Milinda Pointer states refill +3additional, pt needs to be seen prior to future refills.

## 2017-10-31 HISTORY — PX: OTHER SURGICAL HISTORY: SHX169

## 2018-08-10 IMAGING — MR MR ANKLE*L* WO/W CM
5 of 9 series · 20 of 40 positions shown · IV contrast (18ml multihance)
Comparison: Radiographs 09/13/2016

CLINICAL DATA: Palpable mass along the dorsum of the ankle for
approximately 4 months.

EXAM:
MRI OF THE LEFT ANKLE WITHOUT AND WITH CONTRAST
TECHNIQUE: Multiplanar, multisequence MR imaging of the ankle was performed
before and after the administration of intravenous contrast.
CONTRAST:  18mL MULTIHANCE GADOBENATE DIMEGLUMINE 529 MG/ML IV SOLN

[Series 4: T1 · sagittal · 3.0mm · 0.27mm/px · 3 of 24 slices shown (1 of 2)]
[im 1/24]
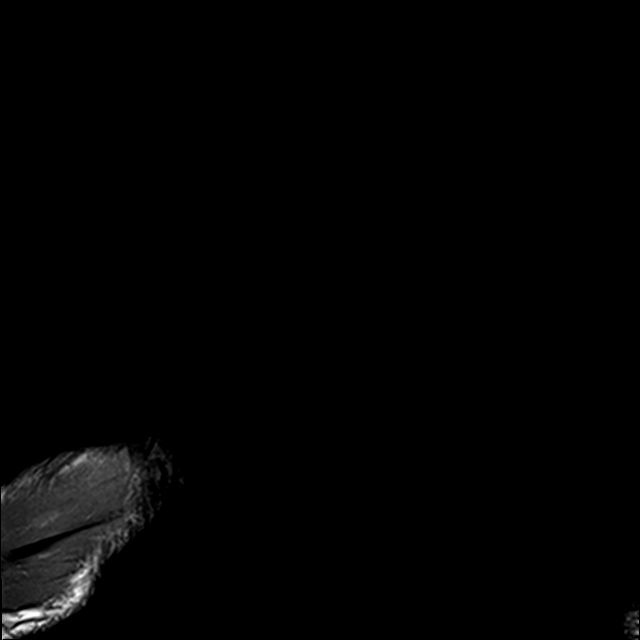
[im 12/24]
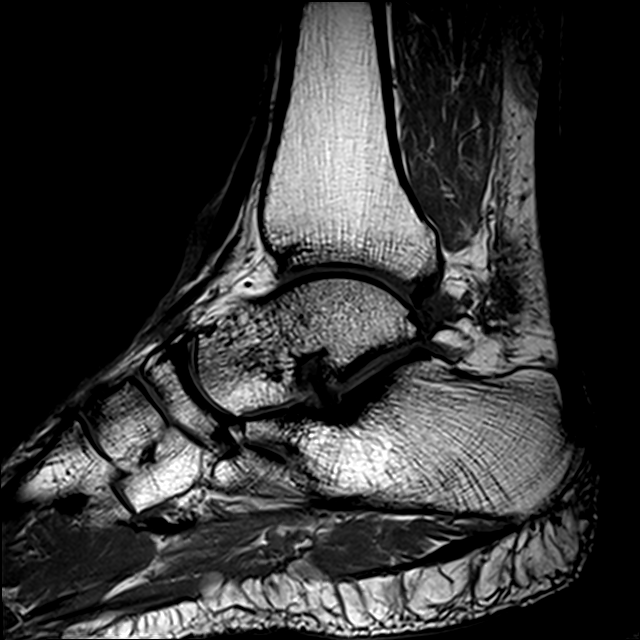
[im 24/24]
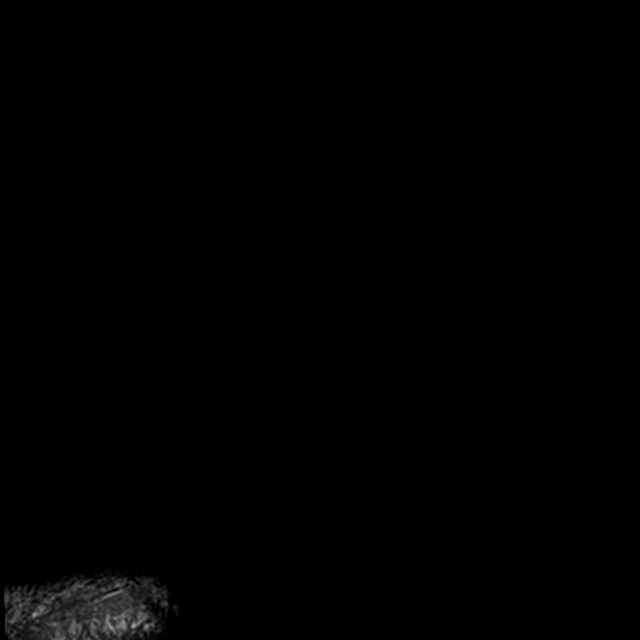

[Series 6: T2 fat-sat · axial · 4.0mm · 0.53mm/px · z∈[-37,+108]mm · 5 of 30 slices shown (1 of 2)]
[im 1/30]
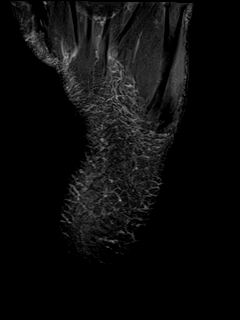
[im 8/30]
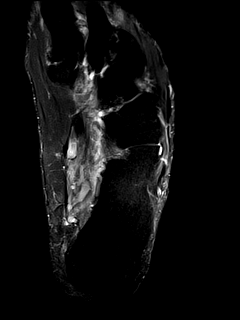
[im 15/30]
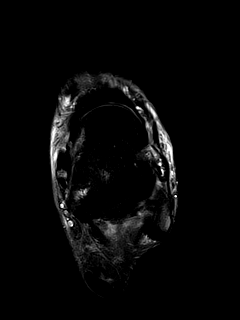
[im 22/30]
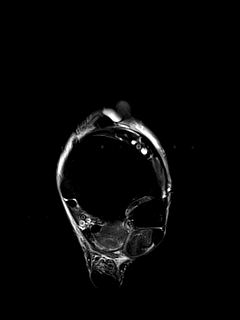
[im 30/30]
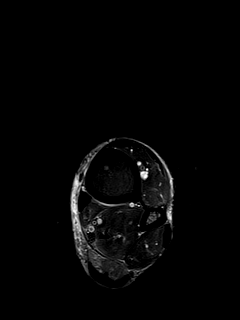

[Series 7: T2 fat-sat · coronal · 4.0mm · 0.33mm/px · 5 of 30 slices shown (2 of 2)]
[im 1/30]
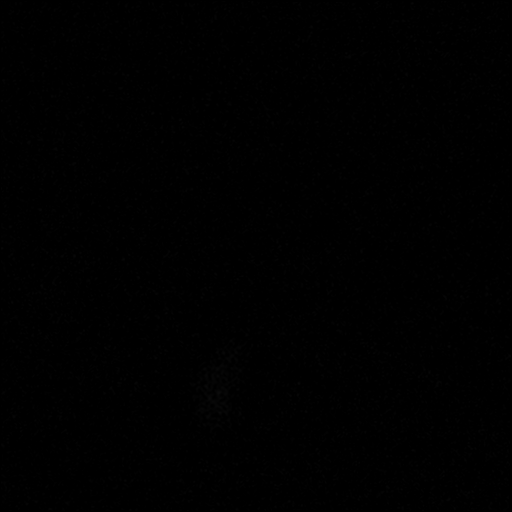
[im 8/30]
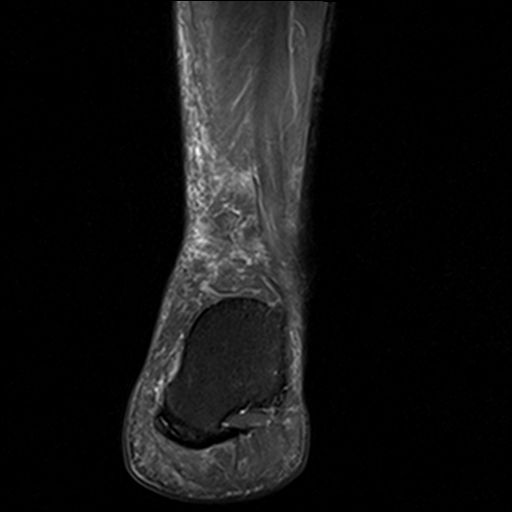
[im 15/30]
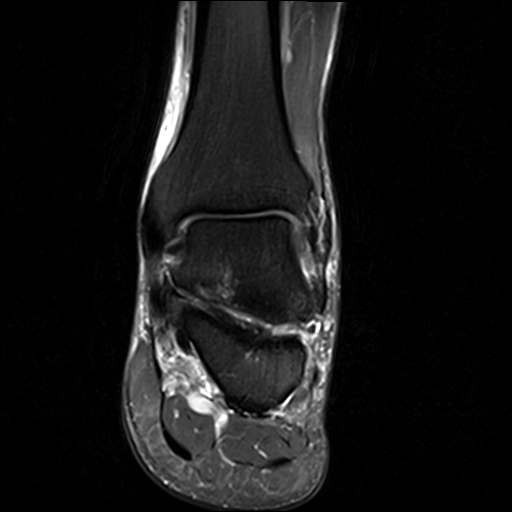
[im 22/30]
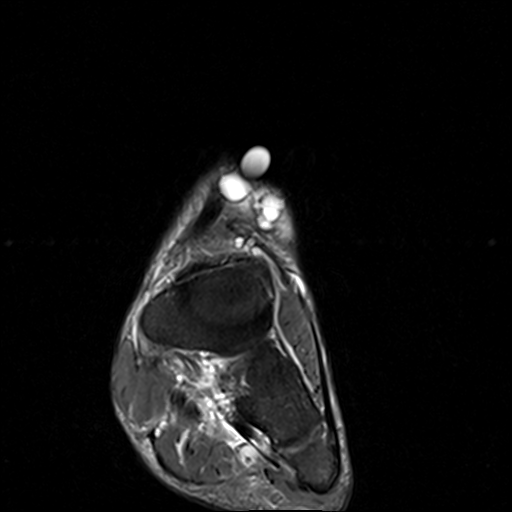
[im 30/30]
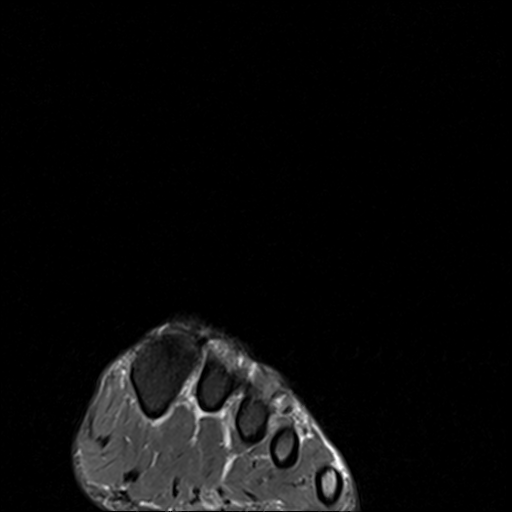

[Series 8: T1 · axial · 4.0mm · 0.27mm/px · z∈[-37,+108]mm · 5 of 30 slices shown (2 of 2)]
[im 1/30]
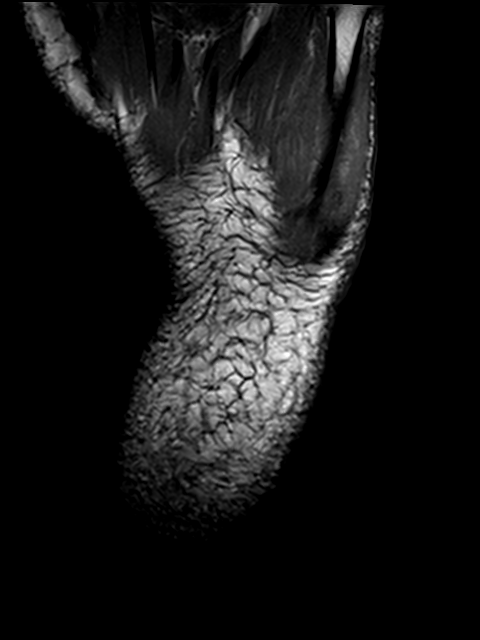
[im 8/30]
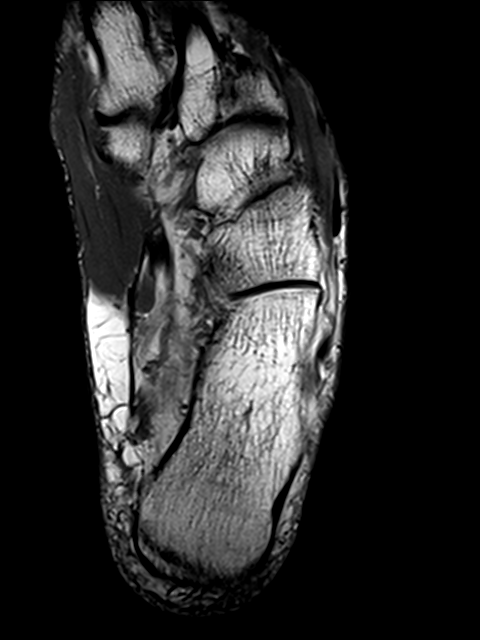
[im 15/30]
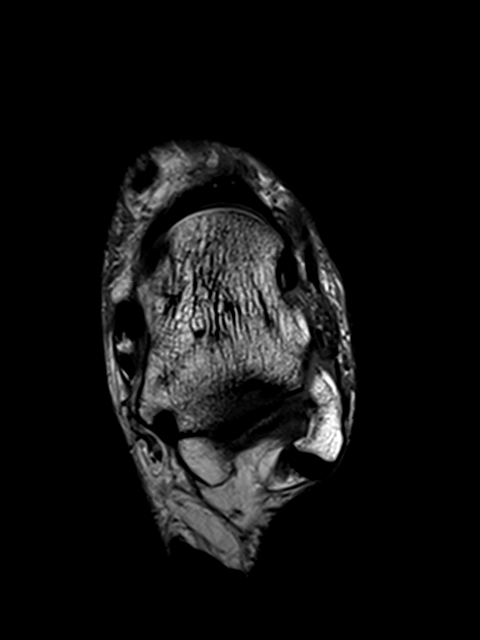
[im 22/30]
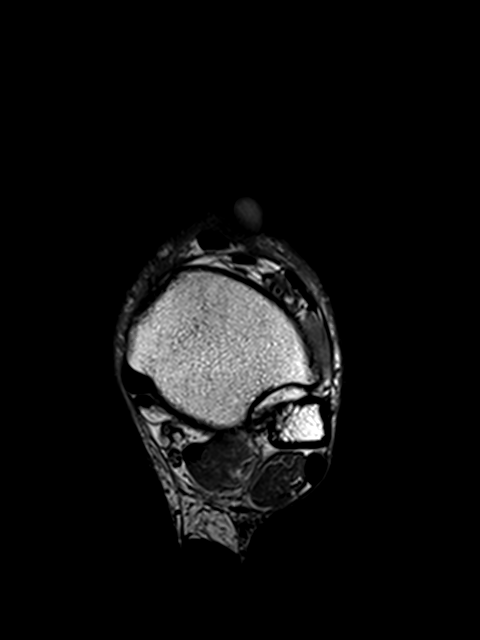
[im 30/30]
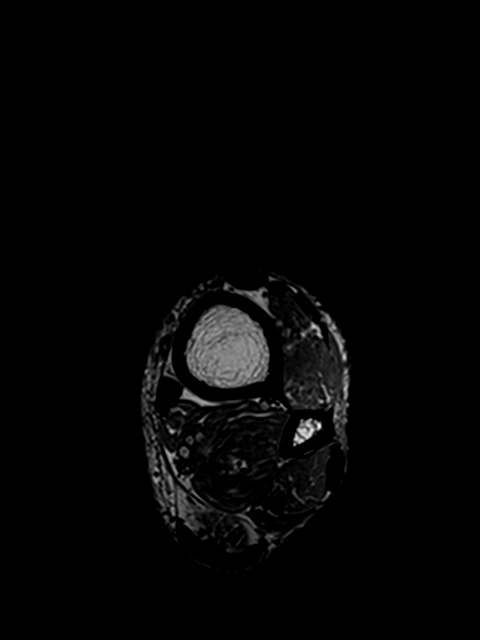

[Series 9: T1 fat-sat · axial · 4.0mm · 0.27mm/px · z∈[-37,-2]mm · 2 of 30 slices shown]
[im 1/30]
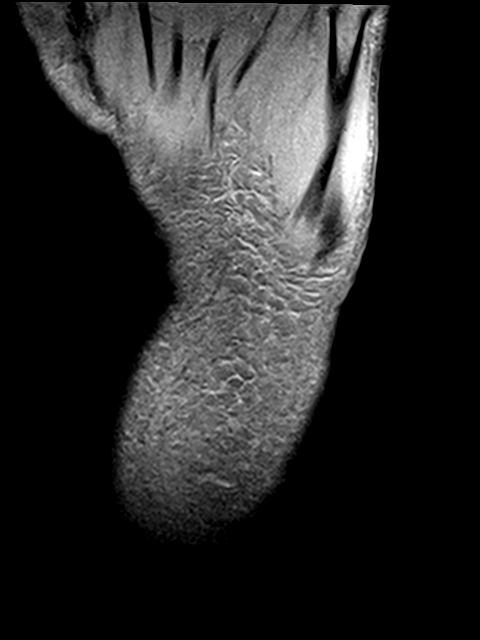
[im 8/30]
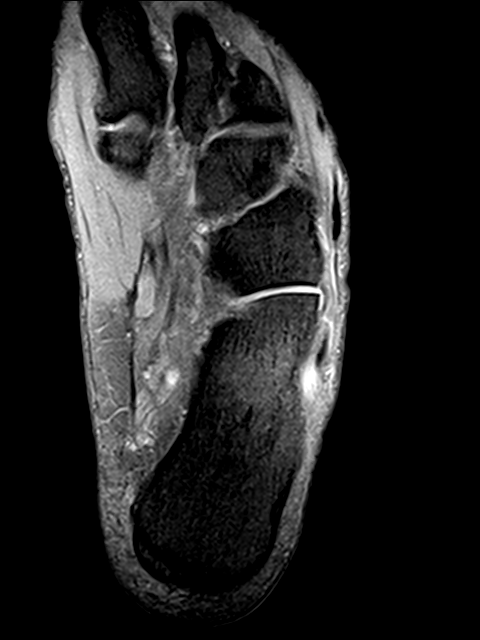

[20 of 40 positions shown; findings below may reference images not displayed]

FINDINGS: TENDONS

Peroneal: Intact.

Posteromedial: Moderate distal tendinopathy involving the posterior
tibialis tendon.

Anterior: Intact.

Achilles: Intact.

Plantar Fascia: Intact.

LIGAMENTS

Lateral: Intact.

Medial: Intact.

CARTILAGE

Ankle Joint: Mild degenerative changes with early spurring. No
cartilage defects or osteochondral lesion. No joint effusion.

Subtalar Joints/Sinus Tarsi: The subtalar joints are maintained. The
sinus tarsi is normal. The cervical and interosseous ligaments are
intact. The spring ligament is intact. Incidental os trigonum.

Bones: No stress fracture or osteochondral abnormality.

Other: The patient's palpable abnormality is marked with a vitamin-E
capsule. The scars pons to 80 multi septated cystic lesion with
typical rim-like synovial enhancement consistent with a ganglion
cyst. It is mainly located between the extensor hallucis longus
tendon and the extensor digitorum longus tendons. It measures
approximately 24 x 13 x 11 mm.
IMPRESSION: IMPRESSION
1. The patient's palpable abnormality corresponds to a ganglion
cyst. It is closely associated with the anterior ankle tendons.
2. Intact medial and lateral ankle ligaments and tendons. There is
moderate tendinopathy involving the posterior tibialis tendon.
3. Mild ankle joint degenerative changes.
4. No stress fracture or osteochondral lesions.

## 2019-04-11 ENCOUNTER — Other Ambulatory Visit: Payer: Self-pay

## 2019-04-11 ENCOUNTER — Encounter: Payer: Self-pay | Admitting: Podiatry

## 2019-04-11 ENCOUNTER — Ambulatory Visit (INDEPENDENT_AMBULATORY_CARE_PROVIDER_SITE_OTHER): Payer: PRIVATE HEALTH INSURANCE | Admitting: Podiatry

## 2019-04-11 VITALS — Temp 98.0°F

## 2019-04-11 DIAGNOSIS — B070 Plantar wart: Secondary | ICD-10-CM | POA: Diagnosis not present

## 2019-04-11 DIAGNOSIS — L989 Disorder of the skin and subcutaneous tissue, unspecified: Secondary | ICD-10-CM

## 2019-04-11 NOTE — Patient Instructions (Signed)

## 2019-04-11 NOTE — Progress Notes (Signed)
He presents today chief complaint of painful lesion sub-fourth metatarsal head of the right foot.  States that this is been there for many many years and not like to get rid of it if possible with feels like a walking on a rock.  Objective: Vital signs are stable alert oriented x3 pulses are palpable.  Evaluation of the right foot demonstrates a solitary lesion sub-fourth metatarsal right.  Appears to be verrucoid in nature.  Assessment: Verruca plantaris.  Plan: At this point surgical curettage was performed and the lesion was sent for pathologic evaluation.  He tolerated procedure well after local anesthetic was injected sub-lesional a total of 6 cc of a 50-50 mixture of Marcaine plain lidocaine plain with epi was injected.  He tolerated procedure well as this was curettage and sent for pathology.  I will follow-up with him in 2 weeks at which time we hope pathology will be back.

## 2019-04-25 ENCOUNTER — Other Ambulatory Visit: Payer: Self-pay

## 2019-04-25 ENCOUNTER — Ambulatory Visit (INDEPENDENT_AMBULATORY_CARE_PROVIDER_SITE_OTHER): Payer: No Typology Code available for payment source | Admitting: Podiatry

## 2019-04-25 ENCOUNTER — Encounter: Payer: Self-pay | Admitting: Podiatry

## 2019-04-25 DIAGNOSIS — L989 Disorder of the skin and subcutaneous tissue, unspecified: Secondary | ICD-10-CM

## 2019-04-25 DIAGNOSIS — B070 Plantar wart: Secondary | ICD-10-CM

## 2019-04-25 NOTE — Progress Notes (Signed)
He presents today for follow-up of his surgical curettage she states that he feels so much better and it seems to be healing up.  Objective: Vital signs are stable he is alert and oriented x3 status post surgical curettage sub-fourth metatarsal head of the right foot.  There is no erythema edema cellulitis drainage odor appears to be healing.  Is relatively hard on palpation.  Assessment: Well-healing surgical foot sub-fourth met head right.  Plan: I encouraged him to place Vaseline on the wound with a Band-Aid during the day but leave open at bedtime.  We have not had the pathology report as of yet we will notify him as to those results.

## 2020-01-29 ENCOUNTER — Ambulatory Visit: Payer: Self-pay | Admitting: Internal Medicine

## 2020-02-13 ENCOUNTER — Ambulatory Visit: Payer: Self-pay | Admitting: Internal Medicine

## 2020-03-03 ENCOUNTER — Ambulatory Visit: Payer: Self-pay | Admitting: Internal Medicine

## 2020-04-10 ENCOUNTER — Other Ambulatory Visit: Payer: Self-pay

## 2020-04-10 ENCOUNTER — Encounter: Payer: Self-pay | Admitting: Internal Medicine

## 2020-04-10 ENCOUNTER — Ambulatory Visit: Payer: Self-pay | Admitting: Internal Medicine

## 2020-04-10 VITALS — BP 122/70 | HR 74 | Resp 12 | Ht 69.0 in | Wt 197.0 lb

## 2020-04-10 DIAGNOSIS — N529 Male erectile dysfunction, unspecified: Secondary | ICD-10-CM

## 2020-04-10 DIAGNOSIS — Z125 Encounter for screening for malignant neoplasm of prostate: Secondary | ICD-10-CM

## 2020-04-10 DIAGNOSIS — Z8719 Personal history of other diseases of the digestive system: Secondary | ICD-10-CM

## 2020-04-10 DIAGNOSIS — A630 Anogenital (venereal) warts: Secondary | ICD-10-CM

## 2020-04-10 DIAGNOSIS — Z7689 Persons encountering health services in other specified circumstances: Secondary | ICD-10-CM

## 2020-04-10 NOTE — Progress Notes (Signed)
Subjective:    Patient ID: Adam Grimes, male   DOB: 1966-10-11, 54 y.o.   MRN: [id number]   HPI   Here to establish.  Here with wife, [name]  1.  Would like a general work up to check for chronic disease, such as diabetes, cholesterol issues, etc.  Has not had testing in some time.  2.  Blood in stool for 20 years.  This is intermittent.  BRBPR--only on outside of stool, can be in toilet water and on tissue.   Had a barium enema performed maybe in 2008 or 2009 without findings. He states later that Dr. Zadie Rhine told him he likely had an internal hemorrhoid.       3.  ED:  Sometimes loses erection in middle of intercourse--just goes away.  Also, sometimes can have extra tissue with one of his testicles.     No outpatient medications have been marked as taking for the 04/10/20 encounter (Office Visit) with Mack Hook, MD.   No Known Allergies  Past Medical History:  Diagnosis Date  . Condyloma   . Internal hemorrhoid   . Subluxation of right toe     History reviewed. No pertinent surgical history.  Family History  Problem Relation Age of Onset  . Depression Mother   . Coronary artery disease Maternal Grandfather     Social History   Socioeconomic History  . Marital status: Married    Spouse name: Adam Grimes  . Number of children: 3  . Years of education: 47  . Highest education level: Some college, no degree  Occupational History  . Occupation: Scientist, research (physical sciences) at [employer] and [employer] Use  . Smoking status: Current Every Day Smoker    Types: Cigars  . Smokeless tobacco: Never Used  . Tobacco comment: 8 cigars daily.  Vaping Use  . Vaping Use: Never used  Substance and Sexual Activity  . Alcohol use: Yes    Alcohol/week: 0.0 standard drinks    Comment: Rare  . Drug use: Yes    Comment: smokes marijuana  . Sexual activity: Yes  Other Topics Concern  . Not on file  Social History Narrative   Lives with wife and 3 children:   38, 17 and 31 yo   Social Determinants of Radio broadcast assistant Strain: Low Risk   . Difficulty of Paying Living Expenses: Not hard at all  Food Insecurity: No Food Insecurity  . Worried About Charity fundraiser in the Last Year: Never true  . Ran Out of Food in the Last Year: Never true  Transportation Needs: No Transportation Needs  . Lack of Transportation (Medical): No  . Lack of Transportation (Non-Medical): No  Physical Activity:   . Days of Exercise per Week:   . Minutes of Exercise per Session:   Stress: Stress Concern Present  . Feeling of Stress : To some extent  Social Connections: Socially Integrated  . Frequency of Communication with Friends and Family: More than three times a week  . Frequency of Social Gatherings with Friends and Family: More than three times a week  . Attends Religious Services: More than 4 times per year  . Active Member of Clubs or Organizations: Yes  . Attends Archivist Meetings: More than 4 times per year  . Marital Status: Married  Human resources officer Violence: Unknown  . Fear of Current or Ex-Partner: Not on file  . Emotionally Abused: No  . Physically Abused: No  .  Sexually Abused: No    Review of Systems    Objective:   BP 122/70 (BP Location: Left Arm, Patient Position: Sitting, Cuff Size: Normal)   Pulse 74   Resp 12   Ht 5\' 9"  (1.753 m)   Wt 197 lb (89.4 kg)   BMI 29.09 kg/m   Physical Exam  NAD HEENT:  PERRL, EOMI, TMs pearly gray, throat without injection Neck:  Supple, No adenopathy, no thyromegaly. Chest:  CTA CV:  RRR with normal S1 and S2, No S3, S4 or murmur.  No carotid bruits.  Carotid, radial and DP pulses normal and equal Abd:  S, NT, No HSM or mass, + BS. GU:  Flat warts on dorsal aspect of penis.  No sores.  No testicular mass or tenderness.  No inguinal hernia. LE:  No edema.   Assessment & Plan   1.  General health concerns:  FLP, CBC, CMP, PSA today  2.  History of BRBPR with  findings of internal hemorrhoids when followed by Dr. Zadie Rhine at [facility].  Will see what CBC shows.  If anemia, will set up for diagnostic colonoscopy and repeat guaiac cards.  Needs orange card, however.  3.  ED:  See labs first before considering treatment.  4.  Genital Warts:  Discussed can consider Podophyllin or cryotherapy.  He will consider.  5.  Overweight:  To work on diet and physical activity--develop goals to maintain health lifestyle.

## 2020-04-10 NOTE — Progress Notes (Signed)
Social worker met with new patient who is scheduled with Dr. Mulberry for medical visit. Social worker completed New Patient Questionnaire which included completion of housing, intimate partner violence, transportation needs, stress, financial resource strain, food insecurity and screeners.   Social Determinants of Health   Financial Resource Strain: Low Risk   . Difficulty of Paying Living Expenses: Not hard at all  Food Insecurity: No Food Insecurity  . Worried About Running Out of Food in the Last Year: Never true  . Ran Out of Food in the Last Year: Never true  Transportation Needs: No Transportation Needs  . Lack of Transportation (Medical): No  . Lack of Transportation (Non-Medical): No  Physical Activity:   . Days of Exercise per Week:   . Minutes of Exercise per Session:   Stress: Stress Concern Present  . Feeling of Stress : To some extent  Social Connections: Socially Integrated  . Frequency of Communication with Friends and Family: More than three times a week  . Frequency of Social Gatherings with Friends and Family: More than three times a week  . Attends Religious Services: More than 4 times per year  . Active Member of Clubs or Organizations: Yes  . Attends Club or Organization Meetings: More than 4 times per year  . Marital Status: Married      Depression screen PHQ 2/9 04/10/2020  Decreased Interest 0  Down, Depressed, Hopeless 1  PHQ - 2 Score 1  Altered sleeping 1  Tired, decreased energy 1  Change in appetite 1  Feeling bad or failure about yourself  0  Trouble concentrating 0  Moving slowly or fidgety/restless 0  Suicidal thoughts 0  PHQ-9 Score 4  Difficult doing work/chores Somewhat difficult    GAD 7 : Generalized Anxiety Score 04/10/2020  Nervous, Anxious, on Edge 0  Control/stop worrying 0  Worry too much - different things 1  Trouble relaxing 1  Restless 1  Easily annoyed or irritable 3  Afraid - awful might happen 2  Total GAD 7 Score 8   Anxiety Difficulty Somewhat difficult     Based on presentation no immediate need for recommendations. Patient shared he has attempted treatment in the past but did not find as helpful. Patient shared he has been able to manage symptoms with playing basketball and his faith. Patient displayed understanding if symptoms become unmanagable he is welcome with this LCSW. Patient accepted LCSW card.   

## 2020-04-11 LAB — COMPREHENSIVE METABOLIC PANEL
ALT: 11 IU/L (ref 0–44)
AST: 14 IU/L (ref 0–40)
Albumin/Globulin Ratio: 2.5 — ABNORMAL HIGH (ref 1.2–2.2)
Albumin: 4.7 g/dL (ref 3.8–4.9)
Alkaline Phosphatase: 88 IU/L (ref 48–121)
BUN/Creatinine Ratio: 12 (ref 9–20)
BUN: 15 mg/dL (ref 6–24)
Bilirubin Total: 0.8 mg/dL (ref 0.0–1.2)
CO2: 25 mmol/L (ref 20–29)
Calcium: 9.5 mg/dL (ref 8.7–10.2)
Chloride: 105 mmol/L (ref 96–106)
Creatinine, Ser: 1.28 mg/dL — ABNORMAL HIGH (ref 0.76–1.27)
GFR calc Af Amer: 74 mL/min/{1.73_m2} (ref >59)
GFR calc non Af Amer: 64 mL/min/{1.73_m2} (ref >59)
Globulin, Total: 1.9 g/dL (ref 1.5–4.5)
Glucose: 84 mg/dL (ref 65–99)
Potassium: 4.6 mmol/L (ref 3.5–5.2)
Sodium: 141 mmol/L (ref 134–144)
Total Protein: 6.6 g/dL (ref 6.0–8.5)

## 2020-04-11 LAB — PSA: Prostate Specific Ag, Serum: 0.2 ng/mL (ref 0.0–4.0)

## 2020-04-11 LAB — CBC WITH DIFFERENTIAL/PLATELET
Basophils Absolute: 0.1 10*3/uL (ref 0.0–0.2)
Basos: 1 %
EOS (ABSOLUTE): 0.4 10*3/uL (ref 0.0–0.4)
Eos: 5 %
Hematocrit: 38.4 % (ref 37.5–51.0)
Hemoglobin: 12.6 g/dL — ABNORMAL LOW (ref 13.0–17.7)
Immature Grans (Abs): 0 10*3/uL (ref 0.0–0.1)
Immature Granulocytes: 0 %
Lymphocytes Absolute: 2.8 10*3/uL (ref 0.7–3.1)
Lymphs: 41 %
MCH: 29.4 pg (ref 26.6–33.0)
MCHC: 32.8 g/dL (ref 31.5–35.7)
MCV: 90 fL (ref 79–97)
Monocytes Absolute: 0.4 10*3/uL (ref 0.1–0.9)
Monocytes: 6 %
Neutrophils Absolute: 3.2 10*3/uL (ref 1.4–7.0)
Neutrophils: 47 %
Platelets: 220 10*3/uL (ref 150–450)
RBC: 4.28 x10E6/uL (ref 4.14–5.80)
RDW: 13.9 % (ref 11.6–15.4)
WBC: 6.9 10*3/uL (ref 3.4–10.8)

## 2020-04-11 LAB — LIPID PANEL W/O CHOL/HDL RATIO
Cholesterol, Total: 168 mg/dL (ref 100–199)
HDL: 55 mg/dL (ref >39)
LDL Chol Calc (NIH): 96 mg/dL (ref 0–99)
Triglycerides: 90 mg/dL (ref 0–149)
VLDL Cholesterol Cal: 17 mg/dL (ref 5–40)

## 2020-05-06 ENCOUNTER — Other Ambulatory Visit: Payer: Self-pay

## 2020-05-06 ENCOUNTER — Other Ambulatory Visit (INDEPENDENT_AMBULATORY_CARE_PROVIDER_SITE_OTHER): Payer: Self-pay

## 2020-05-06 DIAGNOSIS — Z8719 Personal history of other diseases of the digestive system: Secondary | ICD-10-CM

## 2020-05-06 DIAGNOSIS — R7989 Other specified abnormal findings of blood chemistry: Secondary | ICD-10-CM

## 2020-05-06 DIAGNOSIS — D649 Anemia, unspecified: Secondary | ICD-10-CM

## 2020-05-06 LAB — POCT URINALYSIS DIPSTICK
Bilirubin, UA: NEGATIVE
Blood, UA: NEGATIVE
Glucose, UA: NEGATIVE
Ketones, UA: NEGATIVE
Leukocytes, UA: NEGATIVE
Nitrite, UA: NEGATIVE
Protein, UA: POSITIVE — AB
Spec Grav, UA: >=1.03 — AB (ref 1.010–1.025)
Urobilinogen, UA: 0.2 E.U./dL
pH, UA: 6 (ref 5.0–8.0)

## 2020-05-08 ENCOUNTER — Other Ambulatory Visit: Payer: Self-pay

## 2020-05-08 LAB — BASIC METABOLIC PANEL
BUN/Creatinine Ratio: 14 (ref 9–20)
BUN: 20 mg/dL (ref 6–24)
CO2: 28 mmol/L (ref 20–29)
Calcium: 9.6 mg/dL (ref 8.7–10.2)
Chloride: 106 mmol/L (ref 96–106)
Creatinine, Ser: 1.4 mg/dL — ABNORMAL HIGH (ref 0.76–1.27)
GFR calc Af Amer: 67 mL/min/{1.73_m2} (ref >59)
GFR calc non Af Amer: 58 mL/min/{1.73_m2} — ABNORMAL LOW (ref >59)
Glucose: 129 mg/dL — ABNORMAL HIGH (ref 65–99)
Potassium: 4.2 mmol/L (ref 3.5–5.2)
Sodium: 145 mmol/L — ABNORMAL HIGH (ref 134–144)

## 2020-05-08 LAB — VITAMIN B12: Vitamin B-12: 647 pg/mL (ref 232–1245)

## 2020-05-08 LAB — IRON AND TIBC
Iron Saturation: 24 % (ref 15–55)
Iron: 76 ug/dL (ref 38–169)
Total Iron Binding Capacity: 311 ug/dL (ref 250–450)
UIBC: 235 ug/dL (ref 111–343)

## 2020-05-08 LAB — FOLATE: Folate: 9.4 ng/mL (ref >3.0)

## 2020-05-19 NOTE — Addendum Note (Signed)
Addended by: Marcelino Duster on: 05/19/2020 11:19 AM   Modules accepted: Orders

## 2020-07-13 ENCOUNTER — Ambulatory Visit (INDEPENDENT_AMBULATORY_CARE_PROVIDER_SITE_OTHER): Payer: Self-pay | Admitting: Internal Medicine

## 2020-07-13 ENCOUNTER — Encounter: Payer: Self-pay | Admitting: Internal Medicine

## 2020-07-13 VITALS — BP 112/78 | HR 62 | Resp 12 | Ht 69.0 in | Wt 190.0 lb

## 2020-07-13 DIAGNOSIS — Z114 Encounter for screening for human immunodeficiency virus [HIV]: Secondary | ICD-10-CM

## 2020-07-13 DIAGNOSIS — Z1159 Encounter for screening for other viral diseases: Secondary | ICD-10-CM

## 2020-07-13 DIAGNOSIS — I499 Cardiac arrhythmia, unspecified: Secondary | ICD-10-CM

## 2020-07-13 DIAGNOSIS — R739 Hyperglycemia, unspecified: Secondary | ICD-10-CM

## 2020-07-13 DIAGNOSIS — R9431 Abnormal electrocardiogram [ECG] [EKG]: Secondary | ICD-10-CM

## 2020-07-13 DIAGNOSIS — H547 Unspecified visual loss: Secondary | ICD-10-CM

## 2020-07-13 DIAGNOSIS — D649 Anemia, unspecified: Secondary | ICD-10-CM

## 2020-07-13 DIAGNOSIS — R300 Dysuria: Secondary | ICD-10-CM

## 2020-07-13 DIAGNOSIS — Z0000 Encounter for general adult medical examination without abnormal findings: Secondary | ICD-10-CM

## 2020-07-13 LAB — POCT URINALYSIS DIPSTICK
Bilirubin, UA: NEGATIVE
Blood, UA: NEGATIVE
Glucose, UA: NEGATIVE
Ketones, UA: NEGATIVE
Leukocytes, UA: NEGATIVE
Nitrite, UA: NEGATIVE
Protein, UA: POSITIVE — AB
Spec Grav, UA: >=1.03 — AB (ref 1.010–1.025)
Urobilinogen, UA: 0.2 E.U./dL
pH, UA: 5 (ref 5.0–8.0)

## 2020-07-13 NOTE — Patient Instructions (Addendum)
Drink a glass of water before every meal Drink 6-8 glasses of water daily Eat three meals daily Eat a protein and healthy fat with every meal (eggs,fish, chicken, [place] and limit red meats) Eat 5 servings of vegetables daily, mix the colors Eat 2 servings of fruit daily with skin, if skin is edible Use smaller plates Put food/utensils down as you chew and swallow each bite Eat at a table with friends/family at least once daily, no TV Do not eat in front of the TV  Recent studies show that people who consume all of their calories in a 12 hour period lose weight more efficiently.  For example, if you eat your first meal at 7:00 a.m., your last meal of the day should be completed by 7:00 p.m.  Can google "advance directives, [place]"  And bring up form from Secretary of [place]. Print and fill out Or can go to "5 wishes"  Which is also in Spanish and fill out--this costs $5--perhaps easier to use. Designate a Medical Power of Attorney to speak for you if you are unable to speak for yourself when ill or injured

## 2020-07-13 NOTE — Progress Notes (Signed)
Subjective:    Patient ID: Adam Grimes, male   DOB: 04-15-1966, 54 y.o.   MRN: [id number]   HPI   Here for Male CPE:  1.  STE:  He does perform regularly in shower at least once monthly.  No family history of testicular cancer.    2.  PSA/DRE: Last PSA was normal in June 2021.    3.  Guaiac Cards:  Has not yet performed.  Mild anemia and iron studies do not necessarily support iron deficiency.  HX of BRBPR since 44s. Also, with intermittent discomfort in mid epigastrium.  Unable to describe the discomfort--maybe a pressure feeling.  4.  Colonoscopy:  Barium enema in his 20s due to blood in stool.  Normal, but felt possibly to have bleeding internal hemorrhoids.  He does state this is aggravated by passing a hard stool.    5.  Cholesterol/Glucose:  Glucose a bit high in June.  Have not checked an A1C.  Cholesterol panel was fine in June 2021.   Lipid Panel     Component Value Date/Time   CHOL 168 04/10/2020 1349   TRIG 90 04/10/2020 1349   HDL 55 04/10/2020 1349   CHOLHDL 2.6 09/10/2014 0936   VLDL 19 09/10/2014 0936   LDLCALC 96 04/10/2020 1349   LABVLDL 17 04/10/2020 1349     6.  Immunizations:   Discussed COVID vaccine importance at length.   He will think about it some more...  Immunization History  Administered Date(s) Administered   Td 11/27/2006, 07/29/2007     No outpatient medications have been marked as taking for the 07/13/20 encounter (Office Visit) with Mack Hook, MD.   No Known Allergies  Past Medical History:  Diagnosis Date   Condyloma    Internal hemorrhoid    Subluxation of right toe     Past Surgical History:  Procedure Laterality Date   plantar wart surgery Right 2019    Family History  Problem Relation Age of Onset   Depression Mother    Coronary artery disease Maternal Grandfather     Social History   Socioeconomic History   Marital status: Married    Spouse name: Marguerite   Number of children: 3   Years of  education: 12   Highest education level: Some college, no degree  Occupational History   Occupation: Youth Product manager at [employer]   Smoking status: Never Smoker   Smokeless tobacco: Never Used  Scientific laboratory technician Use: Never used  Substance and Sexual Activity   Alcohol use: Yes    Alcohol/week: 0.0 standard drinks    Comment: Rare   Drug use: Yes    Comment: smokes marijuana all day long.   Sexual activity: Yes  Other Topics Concern   Not on file  Social History Narrative   Lives with wife and 3 children:  74, 92 and 83 yo   Social Determinants of Radio broadcast assistant Strain: Low Risk    Difficulty of Paying Living Expenses: Not hard at all  Food Insecurity: No Food Insecurity   Worried About Charity fundraiser in the Last Year: Never true   Arboriculturist in the Last Year: Never true  Transportation Needs: No Transportation Needs   Lack of Transportation (Medical): No   Lack of Transportation (Non-Medical): No  Physical Activity:    Days of Exercise per Week: Not on file   Minutes of Exercise per  Session: Not on file  Stress: Stress Concern Present   Feeling of Stress : To some extent  Social Connections: Engineer, building services of Communication with Friends and Family: More than three times a week   Frequency of Social Gatherings with Friends and Family: More than three times a week   Attends Religious Services: More than 4 times per year   Active Member of [employer] or Organizations: Yes   Attends Music therapist: More than 4 times per year   Marital Status: Married  Human resources officer Violence: Unknown   Fear of Current or Ex-Partner: Not on file   Emotionally Abused: No   Physically Abused: No   Sexually Abused: No     Review of Systems  Constitutional: Negative for appetite change and fatigue.  HENT: Negative for dental problem, hearing loss and sore throat.   Eyes: Positive for visual disturbance  (Describes presbyopia).  Respiratory: Negative for cough and shortness of breath.   Cardiovascular: Negative for chest pain, palpitations and leg swelling.  Gastrointestinal: Positive for abdominal pain (epigastric pressure at times), blood in stool (Intermittent BRBPR) and constipation. Negative for diarrhea.       Sometimes with ribboning of BMs.    Genitourinary: Positive for dysuria (Just today--just a burning at end of urination.). Negative for discharge, genital sores, penile swelling and testicular pain.  Musculoskeletal: Negative for arthralgias.  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  Cannot say how long it has been there.  NO itching.).  Neurological: Positive for numbness (at times on legs from knee down.). Negative for weakness.  Psychiatric/Behavioral: Negative for dysphoric mood. The patient is not nervous/anxious.       Objective:   BP 112/78 (BP Location: Left Arm, Patient Position: Sitting, Cuff Size: Normal)   Pulse 62   Resp 12   Ht 5\' 9"  (1.753 m)   Wt 190 lb (86.2 kg)   BMI 28.06 kg/m   Physical Exam Constitutional:      Appearance: Normal appearance. He is normal weight.  HENT:     Head: Normocephalic and atraumatic.     Right Ear: Hearing, tympanic membrane, ear canal and external ear normal.     Left Ear: Hearing, tympanic membrane, ear canal and external ear normal.     Nose: Nose normal.     Mouth/Throat:     Mouth: Mucous membranes are moist.     Pharynx: Oropharynx is clear.  Eyes:     Extraocular Movements: Extraocular movements intact.     Conjunctiva/sclera: Conjunctivae normal.     Pupils: Pupils are equal, round, and reactive to light.     Comments: Discs sharp bilaterally  Neck:     Thyroid: No thyroid mass or thyromegaly.  Cardiovascular:     Rate and Rhythm: Normal rate. Rhythm regularly irregular.     Heart sounds: S1 normal and S2 normal. Murmur (Possible grade I/VI SEM in LSB, maybe radiating to right second  interspace, but not carotids.) heard.    No S3 or S4 sounds.     Comments: Rhythm almost a trigeminy--2-3 regular beats and then slight pause.  Unable to ascertain if pause with inspiration only.  No carotid bruits.  Carotid, radial, femoral, DP and PT pulses normal and equal.  Pulmonary:     Effort: Pulmonary effort is normal.     Breath sounds: Normal breath sounds.  Chest:  Breasts:    Right: No axillary adenopathy or supraclavicular adenopathy.  Left: No axillary adenopathy or supraclavicular adenopathy.  Abdominal:     General: Bowel sounds are normal.     Palpations: Abdomen is soft. There is no hepatomegaly, splenomegaly or mass.     Tenderness: There is no abdominal tenderness.     Hernia: No hernia is present.  Genitourinary:    Penis: Normal and circumcised.      Testes:        Right: Mass, tenderness or swelling not present. Right testis is descended.        Left: Mass, tenderness or swelling not present. Left testis is descended.  Musculoskeletal:        General: Normal range of motion.     Cervical back: Full passive range of motion without pain, normal range of motion and neck supple.     Comments: Fit and well defined musculature throughout.  Lymphadenopathy:     Head:     Right side of head: No submental or submandibular adenopathy.     Left side of head: No submental or submandibular adenopathy.     Cervical: No cervical adenopathy.     Upper Body:     Right upper body: No supraclavicular or axillary adenopathy.     Left upper body: No supraclavicular or axillary adenopathy.     Lower Body: No right inguinal adenopathy. No left inguinal adenopathy.  Skin:    General: Skin is warm.     Capillary Refill: Capillary refill takes less than 2 seconds.          Comments: No active rash found on exam and patient unable to find any involved areas.  Neurological:     Mental Status: He is alert and oriented to person, place, and time.     Cranial Nerves: Cranial  nerves are intact.     Sensory: Sensation is intact.     Motor: Motor function is intact.     Coordination: Coordination is intact.     Gait: Gait is intact.     Deep Tendon Reflexes: Reflexes are normal and symmetric.  Psychiatric:        Attention and Perception: Attention normal.        Mood and Affect: Mood and affect normal.        Speech: Speech normal.        Behavior: Behavior normal. Behavior is cooperative.        Thought Content: Thought content normal.        Cognition and Memory: Cognition normal.        Judgment: Judgment normal.      Assessment & Plan  CPE Encouraged COVID vaccination, Tdap, influenza.  He will consider.  2.  Mild anemia:  repeat CBC today.   Diagnostic colonoscopy ordered 2 months ago, but patient needs to complete application for orange card for referral.  He states he contineus to work on this. Still working on orange card for diagnostic colonoscopy--ordered in July  3.  Mild murmur with irregular HB; ECG with first degree heart block and repolarization abnormalities in fit 54 yo males.  Referral to cardiology.  4.  Decreased visual acuity:  [facility].  5.  Penile irritation/dysuria without findings on exam:  RPR, HIV, GC/chlamydia, Hep C  6.  Hyperglycemia:  A1C

## 2020-07-14 LAB — HIV ANTIBODY (ROUTINE TESTING W REFLEX): HIV Screen 4th Generation wRfx: NONREACTIVE

## 2020-07-14 LAB — CBC WITH DIFFERENTIAL/PLATELET
Basophils Absolute: 0.1 10*3/uL (ref 0.0–0.2)
Basos: 1 %
EOS (ABSOLUTE): 0.4 10*3/uL (ref 0.0–0.4)
Eos: 4 %
Hematocrit: 36.3 % — ABNORMAL LOW (ref 37.5–51.0)
Hemoglobin: 12.6 g/dL — ABNORMAL LOW (ref 13.0–17.7)
Immature Grans (Abs): 0 10*3/uL (ref 0.0–0.1)
Immature Granulocytes: 0 %
Lymphocytes Absolute: 2.7 10*3/uL (ref 0.7–3.1)
Lymphs: 31 %
MCH: 31 pg (ref 26.6–33.0)
MCHC: 34.7 g/dL (ref 31.5–35.7)
MCV: 89 fL (ref 79–97)
Monocytes Absolute: 0.7 10*3/uL (ref 0.1–0.9)
Monocytes: 8 %
Neutrophils Absolute: 4.9 10*3/uL (ref 1.4–7.0)
Neutrophils: 56 %
Platelets: 193 10*3/uL (ref 150–450)
RBC: 4.06 x10E6/uL — ABNORMAL LOW (ref 4.14–5.80)
RDW: 13.8 % (ref 11.6–15.4)
WBC: 8.7 10*3/uL (ref 3.4–10.8)

## 2020-07-14 LAB — HGB A1C W/O EAG: Hgb A1c MFr Bld: 5.6 % (ref 4.8–5.6)

## 2020-07-14 LAB — HEPATITIS C ANTIBODY: Hep C Virus Ab: <0.1 s/co ratio (ref 0.0–0.9)

## 2020-07-15 LAB — GC/CHLAMYDIA PROBE AMP
Chlamydia trachomatis, NAA: NEGATIVE
Neisseria Gonorrhoeae by PCR: NEGATIVE

## 2020-07-23 ENCOUNTER — Encounter: Payer: Self-pay | Admitting: General Practice

## 2020-07-28 ENCOUNTER — Other Ambulatory Visit: Payer: Self-pay | Admitting: Clinical

## 2020-11-25 ENCOUNTER — Telehealth: Payer: Self-pay | Admitting: Internal Medicine

## 2020-11-25 NOTE — Telephone Encounter (Signed)
Patient's wife called asking for an appointment for the patient because he discovered a lump in the Inner part of his buttocks in the size of nickel about a week ago. According to the patient's wife, Patient is experiencing pain. Please advise.

## 2020-11-26 NOTE — Telephone Encounter (Signed)
See if we can get him in as an acute--tomorrow or  Tuesday.

## 2020-11-27 ENCOUNTER — Ambulatory Visit: Payer: Self-pay | Admitting: Internal Medicine

## 2020-12-02 ENCOUNTER — Other Ambulatory Visit: Payer: Self-pay

## 2020-12-02 ENCOUNTER — Encounter: Payer: Self-pay | Admitting: Internal Medicine

## 2020-12-02 ENCOUNTER — Ambulatory Visit: Payer: Self-pay | Admitting: Internal Medicine

## 2020-12-02 VITALS — BP 146/93 | HR 63 | Resp 14 | Ht 69.0 in | Wt 202.0 lb

## 2020-12-02 DIAGNOSIS — K6289 Other specified diseases of anus and rectum: Secondary | ICD-10-CM

## 2020-12-02 DIAGNOSIS — M79671 Pain in right foot: Secondary | ICD-10-CM | POA: Insufficient documentation

## 2020-12-02 NOTE — Telephone Encounter (Signed)
Patient was schedule as an acute appointment on 12/02/2020.

## 2020-12-02 NOTE — Progress Notes (Signed)
    Subjective:    Patient ID: Adam Grimes, male   DOB: 03-Mar-1966, 55 y.o.   MRN: [id number]   HPI   1.  Perianal swelling:  Worked in for a painful swelling near anus.  Does not want to be seen for this today--did not realize that's what his wife called in for him to be seen. Gets this once yearly. Has never had any drainage. He cannot recall having constipation or diarrhea preceding.   Did have a look at it--thought it looked like a pimple without a head, though did not have erythema surrounding. He is more concerned with this being herpes. He has also noted dots on his palms and plantar feet.  2.  Callus on right foot.  Damaged his left little toe and feels the way he walks due to abnormal anatomy of little toe--goes in and out of joint and phalanx actually appears to sitting on top of metatarsal head.  Injury occurred many years ago--age 41 yo..  He believes the callus is occurring as most of his weight is born on the bone of his 4th metatarsal when he pushes off from lateral foot instead of the 5th.  Lots of pain where the callus formation is and has to shave regularly.  No outpatient medications have been marked as taking for the 12/02/20 encounter (Office Visit) with Mack Hook, MD.   No Known Allergies   Review of Systems    Objective:   BP (!) 146/93 (BP Location: Left Arm, Patient Position: Sitting, Cuff Size: Normal)   Pulse 63   Resp 14   Ht 5\' 9"  (1.753 m)   Wt 202 lb (91.6 kg)   BMI 29.83 kg/m   Physical Exam  NAD  Perianal area:  2 cm by 1 cm or so smooth soft tissue mass to patient's right of anus.  Currently with minimal tenderness and no surrounding erythema.  No obvious opening.  Right foot:  1 cm ovoid white callus over what appears to be 4th metatarsal head.  Tender to palpation.  Has superficial cut in overlying skin from apparently the razor he uses to shave the callus.   Right 5th phalanx appears dislocated over top of 5th metatarsal head  and sticks up above foot.   When patient bears weight, foot appears to spread laterally and puts pressure on area of callus formation.  Assessment & Plan   1.  Perianal sebaceous or epidermoid cyst:  As it sounds like he has recurring episodes of inflammation, would like for him to have this removed when no infection involved and to prevent this from becoming an abscess in the future.  Referral to Gen Surgery.  2  Right foot pain and callus:  Podiatry referral.  Not clear anything can be done to repair his abnormal foot anatomy at this point, though that would be the goal if possible.  Otherwise, will likely need some sort of shoe insert to keep foot in a better position and/or keep pressure off the 4th ray.

## 2021-01-04 ENCOUNTER — Ambulatory Visit: Payer: Managed Care, Other (non HMO)

## 2021-04-19 DIAGNOSIS — R9431 Abnormal electrocardiogram [ECG] [EKG]: Secondary | ICD-10-CM | POA: Insufficient documentation

## 2021-04-19 DIAGNOSIS — R739 Hyperglycemia, unspecified: Secondary | ICD-10-CM | POA: Insufficient documentation

## 2021-04-19 DIAGNOSIS — H547 Unspecified visual loss: Secondary | ICD-10-CM | POA: Insufficient documentation

## 2021-04-19 DIAGNOSIS — D649 Anemia, unspecified: Secondary | ICD-10-CM | POA: Insufficient documentation

## 2021-04-19 DIAGNOSIS — R300 Dysuria: Secondary | ICD-10-CM | POA: Insufficient documentation

## 2021-04-19 DIAGNOSIS — I499 Cardiac arrhythmia, unspecified: Secondary | ICD-10-CM | POA: Insufficient documentation

## 2021-07-14 ENCOUNTER — Other Ambulatory Visit: Payer: Self-pay

## 2021-07-14 ENCOUNTER — Encounter: Payer: Self-pay | Admitting: Internal Medicine

## 2021-07-14 ENCOUNTER — Ambulatory Visit: Payer: Self-pay | Admitting: Internal Medicine

## 2021-07-14 VITALS — BP 136/90 | HR 48 | Resp 12 | Ht 69.75 in | Wt 189.0 lb

## 2021-07-14 DIAGNOSIS — E049 Nontoxic goiter, unspecified: Secondary | ICD-10-CM | POA: Insufficient documentation

## 2021-07-14 DIAGNOSIS — Z0000 Encounter for general adult medical examination without abnormal findings: Secondary | ICD-10-CM

## 2021-07-14 DIAGNOSIS — K029 Dental caries, unspecified: Secondary | ICD-10-CM | POA: Insufficient documentation

## 2021-07-14 DIAGNOSIS — M79671 Pain in right foot: Secondary | ICD-10-CM

## 2021-07-14 DIAGNOSIS — R7989 Other specified abnormal findings of blood chemistry: Secondary | ICD-10-CM

## 2021-07-14 NOTE — Progress Notes (Signed)
Subjective:    Patient ID: Adam Grimes, male   DOB: [birth year], 55 y.o.   MRN: [id number]   HPI  Here for Male CPE:  1.  STE:  More than monthly.  No findings.  No family history of testicular cancer.    2.  PSA: Normal at 0.2 on 04/10/20.  No family history of prostate cancer.    3.  Guaiac Cards/FIT:  Never.      4.  Colonoscopy: Never.  No family history of colon cancer.    5.  Cholesterol/Glucose:  Cholesterol was fine 03/2020 and glucose was a bit high with high normal A1C of 5.6%  6.  Immunizations:  Has not had COVID vaccination.  Does not feel he needs.  He is not up to date regarding Shingrix, Tdap, COVID, influenza Immunization History  Administered Date(s) Administered   Td 11/27/2006, 07/29/2007     No outpatient medications have been marked as taking for the 07/14/21 encounter (Office Visit) with Mack Hook, MD.   No Known Allergies   Review of Systems  HENT:  Positive for dental problem (Has few teeth.).   Respiratory:  Negative for shortness of breath.   Cardiovascular:  Negative for chest pain, palpitations and leg swelling.  Gastrointestinal:  Positive for blood in stool (Has been told due to hemorrhoids in past, but has never had colonoscopy.). Negative for abdominal pain, constipation and diarrhea.  Genitourinary:  Negative for decreased urine volume, dysuria and penile discharge.  Musculoskeletal:        Right foot with continued problem with callus formation from injury in past with deformity.  Was sent to podiatry, but had a death in family and unable to go.  Skin:  Negative for rash.  Neurological:  Negative for weakness and numbness.       Sometimes when gets out of shower, gets pins and needles in his legs below knees.  Lasts just a few seconds until rubs his legs dry with towel.  Occurs once to twice monthly--happens just as he turns the water off only.    History of vertigo twice in his life--one was last year.   Psychiatric/Behavioral:  Negative for dysphoric mood. The patient is not nervous/anxious.      Objective:   BP 136/90 (BP Location: Left Arm, Patient Position: Sitting, Cuff Size: Normal)   Pulse (!) 48   Resp 12   Ht 5' 9.75" (1.772 m)   Wt 189 lb (85.7 kg)   BMI 27.31 kg/m   Physical Exam Constitutional:      Appearance: He is normal weight.  HENT:     Head: Normocephalic and atraumatic.     Right Ear: Tympanic membrane, ear canal and external ear normal.     Left Ear: Tympanic membrane and external ear normal.     Ears:     Comments: Cerumen blocks much of left canal.  Visualized TM normal    Nose: Nose normal.     Mouth/Throat:     Mouth: Mucous membranes are moist.     Pharynx: Oropharynx is clear.     Comments: Significant dental loss and decay of remaining teeth--mainly incisors left Eyes:     Extraocular Movements: Extraocular movements intact.     Conjunctiva/sclera: Conjunctivae normal.     Pupils: Pupils are equal, round, and reactive to light.     Comments: Discs sharp bilaterally.  Neck:     Thyroid: Thyromegaly (Nodular) present.  Cardiovascular:     Rate and Rhythm:  Normal rate and regular rhythm.     Heart sounds: S1 normal and S2 normal. No murmur heard.   No friction rub. No S3 or S4 sounds.  Pulmonary:     Effort: Pulmonary effort is normal.     Breath sounds: Normal breath sounds.  Abdominal:     General: Abdomen is flat. Bowel sounds are normal.     Palpations: Abdomen is soft. There is no hepatomegaly, splenomegaly or mass.     Tenderness: There is no abdominal tenderness.     Hernia: No hernia is present.  Genitourinary:    Comments: Declined Musculoskeletal:     Cervical back: Normal range of motion and neck supple.     Right lower leg: No edema.     Left lower leg: No edema.       Feet:  Feet:     Comments: Right little toe with proximal phalanx appearing to sit on top of distal MCP and toe held in flexed position.  He is able to pop  the proximal phalanx into more anatomic position, which extends the toe out into a more normal position, but returns to abnormal position as soon as he let the toe go.  Callus over plantar metatarsal head.  Lymphadenopathy:     Head:     Right side of head: No submental or submandibular adenopathy.     Left side of head: No submental or submandibular adenopathy.     Cervical: No cervical adenopathy.     Upper Body:     Right upper body: No supraclavicular or axillary adenopathy.     Left upper body: No supraclavicular or axillary adenopathy.     Lower Body: No right inguinal adenopathy. No left inguinal adenopathy.  Skin:    General: Skin is warm.     Capillary Refill: Capillary refill takes less than 2 seconds.     Comments: Some dryness at low back  Neurological:     Mental Status: He is alert.     Cranial Nerves: Cranial nerves are intact.     Sensory: Sensation is intact.     Motor: Motor function is intact.     Coordination: Coordination is intact.     Gait: Gait is intact.     Deep Tendon Reflexes: Reflexes are normal and symmetric.  Psychiatric:        Mood and Affect: Mood normal.        Speech: Speech normal.        Behavior: Behavior normal. Behavior is cooperative.     Assessment & Plan    CPE FIT test to return in 1 week To consider Influenza, COVID, Shingles, Tdap vaccinations.  He will get back with his decision. Fasting labs in next 1-2 weeks.   2.  Dental loss and decay:  Dental referral  3.  Right foot pain/abnormality of right 5th MTP joint:  Podiatry referral.  4.  History of elevated glucose:  A1C with fasting labs  5.  History of elevated Creatinine:  CMP with fasting labs  6.  Nodular Thyromegaly:  TSH with labs and thyroid ultrasound.  7.  Tingling of lower legs:  rare.  Check B12 and folate with fasting labs

## 2021-07-14 NOTE — Patient Instructions (Signed)
Drink a glass of water before every meal Drink 6-8 glasses of water daily Eat three meals daily Eat a protein and healthy fat with every meal (eggs,fish, chicken, [place] and limit red meats) Eat 5 servings of vegetables daily, mix the colors Eat 2 servings of fruit daily with skin, if skin is edible Use smaller plates Put food/utensils down as you chew and swallow each bite Eat at a table with friends/family at least once daily, no TV Do not eat in front of the TV  Recent studies show that people who consume all of their calories in a 12 hour period lose weight more efficiently.  For example, if you eat your first meal at 7:00 a.m., your last meal of the day should be completed by 7:00 p.m.   3-4 cups of 2% milk daily. Consider one serving of candy weekly  Let us know about vaccines:  COVID, Shingles, Influenza, Tdap

## 2021-07-16 ENCOUNTER — Other Ambulatory Visit: Payer: Self-pay | Admitting: Internal Medicine

## 2021-07-20 ENCOUNTER — Other Ambulatory Visit: Payer: Self-pay

## 2021-07-20 DIAGNOSIS — E049 Nontoxic goiter, unspecified: Secondary | ICD-10-CM

## 2021-07-20 DIAGNOSIS — R7989 Other specified abnormal findings of blood chemistry: Secondary | ICD-10-CM

## 2021-07-20 DIAGNOSIS — M79671 Pain in right foot: Secondary | ICD-10-CM

## 2021-07-20 DIAGNOSIS — R739 Hyperglycemia, unspecified: Secondary | ICD-10-CM

## 2021-07-20 DIAGNOSIS — D649 Anemia, unspecified: Secondary | ICD-10-CM

## 2022-06-01 LAB — GLUCOSE, POCT (MANUAL RESULT ENTRY): POC Glucose: 103 mg/dl — AB (ref 70–99)

## 2022-07-14 ENCOUNTER — Ambulatory Visit (INDEPENDENT_AMBULATORY_CARE_PROVIDER_SITE_OTHER): Payer: Self-pay | Admitting: Internal Medicine

## 2022-07-14 ENCOUNTER — Encounter: Payer: Self-pay | Admitting: Internal Medicine

## 2022-07-14 VITALS — BP 130/88 | HR 60 | Resp 12 | Ht 69.5 in | Wt 184.0 lb

## 2022-07-14 DIAGNOSIS — R7989 Other specified abnormal findings of blood chemistry: Secondary | ICD-10-CM

## 2022-07-14 DIAGNOSIS — Z13220 Encounter for screening for lipoid disorders: Secondary | ICD-10-CM

## 2022-07-14 DIAGNOSIS — M79671 Pain in right foot: Secondary | ICD-10-CM

## 2022-07-14 DIAGNOSIS — Z125 Encounter for screening for malignant neoplasm of prostate: Secondary | ICD-10-CM

## 2022-07-14 DIAGNOSIS — Z9189 Other specified personal risk factors, not elsewhere classified: Secondary | ICD-10-CM

## 2022-07-14 NOTE — Progress Notes (Signed)
    Subjective:    Patient ID: Adam Grimes, male   DOB: Dec 21, 1965, 56 y.o.   MRN: [id number]   HPI  Would like to cancel CPE and just focus on his foot today.    Right foot previously cared for by Dr. Milinda Pointer, Podiatry.  Initial surgical intervention felt great for a short while and then pain and callus recurred at plantar metatarsal head of right 4th ray.  He is very involved in senior basketball olympics and making it hard for him to plant and pivot with this foot due to pain.  No outpatient medications have been marked as taking for the 07/14/22 encounter (Office Visit) with Mack Hook, MD.   No Known Allergies   Review of Systems    Objective:   BP 130/88 (BP Location: Right Arm, Patient Position: Sitting, Cuff Size: Normal)   Pulse 60   Resp 12   Ht 5' 9.5" (1.765 m)   Wt 184 lb (83.5 kg)   BMI 26.78 kg/m   Physical Exam NAD Lungs:  CTA CV:  RRR without murmur or rub.  Radial pulses normal and equal. LE:  No edema. Right foot:  proximal phalanx of 5th little toe appears dislocated up above metatarsal head.  Has formation of callus with central pit at 4th metatarsal head on plantar foot.  This is really his source of pain and tenderness with compression in particular.   Assessment & Plan    Right Foot pain:  will refer back to Dr. Milinda Pointer, podiatry.  He will discuss costs involved with moving toward a more long lasting and involved surgical intervention as laid out by Dr. Milinda Pointer back in 2020.  Also to see what ACA coverage is accepted with his office as an option.  If not feasible, will consider referral to [facility] and see about charity care (or [facility]).  2.  Discussed diet and how to improve.  Sounds like missing out on protein and good fats.    3.  History of elevated creatinine with UA dipstick showing concentrated urine and protein.  UA microscopic, Urine microalbumin/crea, CMP, CBC today.    4.  HM:  needs several vaccines, PSA and above labs today.     Reschedule CPE

## 2022-07-15 LAB — MICROALBUMIN / CREATININE URINE RATIO
Creatinine, Urine: 205.6 mg/dL
Microalb/Creat Ratio: 3 mg/g creat (ref 0–29)
Microalbumin, Urine: 6 ug/mL

## 2022-07-15 LAB — CBC WITH DIFFERENTIAL/PLATELET
Basophils Absolute: 0.1 10*3/uL (ref 0.0–0.2)
Basos: 1 %
EOS (ABSOLUTE): 0.3 10*3/uL (ref 0.0–0.4)
Eos: 4 %
Hematocrit: 38.1 % (ref 37.5–51.0)
Hemoglobin: 12.6 g/dL — ABNORMAL LOW (ref 13.0–17.7)
Immature Grans (Abs): 0 10*3/uL (ref 0.0–0.1)
Immature Granulocytes: 0 %
Lymphocytes Absolute: 2.2 10*3/uL (ref 0.7–3.1)
Lymphs: 30 %
MCH: 30.4 pg (ref 26.6–33.0)
MCHC: 33.1 g/dL (ref 31.5–35.7)
MCV: 92 fL (ref 79–97)
Monocytes Absolute: 0.5 10*3/uL (ref 0.1–0.9)
Monocytes: 7 %
Neutrophils Absolute: 4.3 10*3/uL (ref 1.4–7.0)
Neutrophils: 58 %
Platelets: 176 10*3/uL (ref 150–450)
RBC: 4.15 x10E6/uL (ref 4.14–5.80)
RDW: 14.2 % (ref 11.6–15.4)
WBC: 7.3 10*3/uL (ref 3.4–10.8)

## 2022-07-15 LAB — COMPREHENSIVE METABOLIC PANEL
ALT: 27 IU/L (ref 0–44)
AST: 33 IU/L (ref 0–40)
Albumin/Globulin Ratio: 2 (ref 1.2–2.2)
Albumin: 4.6 g/dL (ref 3.8–4.9)
Alkaline Phosphatase: 93 IU/L (ref 44–121)
BUN/Creatinine Ratio: 12 (ref 9–20)
BUN: 16 mg/dL (ref 6–24)
Bilirubin Total: 1 mg/dL (ref 0.0–1.2)
CO2: 26 mmol/L (ref 20–29)
Calcium: 9.7 mg/dL (ref 8.7–10.2)
Chloride: 103 mmol/L (ref 96–106)
Creatinine, Ser: 1.37 mg/dL — ABNORMAL HIGH (ref 0.76–1.27)
Globulin, Total: 2.3 g/dL (ref 1.5–4.5)
Glucose: 85 mg/dL (ref 70–99)
Potassium: 4.2 mmol/L (ref 3.5–5.2)
Sodium: 143 mmol/L (ref 134–144)
Total Protein: 6.9 g/dL (ref 6.0–8.5)
eGFR: 62 mL/min/{1.73_m2} (ref >59)

## 2022-07-15 LAB — URINALYSIS, ROUTINE W REFLEX MICROSCOPIC
Bilirubin, UA: NEGATIVE
Glucose, UA: NEGATIVE
Ketones, UA: NEGATIVE
Leukocytes,UA: NEGATIVE
Nitrite, UA: NEGATIVE
Protein,UA: NEGATIVE
RBC, UA: NEGATIVE
Specific Gravity, UA: 1.021 (ref 1.005–1.030)
Urobilinogen, Ur: 0.2 mg/dL (ref 0.2–1.0)
pH, UA: 6.5 (ref 5.0–7.5)

## 2022-07-15 LAB — PSA: Prostate Specific Ag, Serum: 0.2 ng/mL (ref 0.0–4.0)

## 2022-07-15 LAB — LIPID PANEL W/O CHOL/HDL RATIO
Cholesterol, Total: 159 mg/dL (ref 100–199)
HDL: 61 mg/dL (ref >39)
LDL Chol Calc (NIH): 86 mg/dL (ref 0–99)
Triglycerides: 61 mg/dL (ref 0–149)
VLDL Cholesterol Cal: 12 mg/dL (ref 5–40)

## 2022-07-26 ENCOUNTER — Ambulatory Visit: Payer: No Typology Code available for payment source | Admitting: Podiatry

## 2022-08-09 ENCOUNTER — Ambulatory Visit (INDEPENDENT_AMBULATORY_CARE_PROVIDER_SITE_OTHER): Payer: No Typology Code available for payment source | Admitting: Podiatry

## 2022-08-09 ENCOUNTER — Ambulatory Visit (INDEPENDENT_AMBULATORY_CARE_PROVIDER_SITE_OTHER): Payer: No Typology Code available for payment source

## 2022-08-09 DIAGNOSIS — M778 Other enthesopathies, not elsewhere classified: Secondary | ICD-10-CM | POA: Diagnosis not present

## 2022-08-09 DIAGNOSIS — D2371 Other benign neoplasm of skin of right lower limb, including hip: Secondary | ICD-10-CM

## 2022-08-09 NOTE — Progress Notes (Signed)
He presents today for a chief concern of the right pinky toe since it is discolored and contracted he says been this way for about 20 years he would like to see if there is anything can be done about it.  He is also concerned about the callus placed on the bottom of the fifth metatarsal but particularly 1 on the plantar aspect of the fourth metatarsal.  He states that is in Constant pain with sharp shooting pains.  He also goes on to say that he uses a razor blade to shave it down and he feels much better once he does that.  Objective: Vital signs are stable alert and oriented x3.  Pulses are palpable.  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  The fourth metatarsal is plantarflexed which is resulting in a pressure lesion on the plantar aspect as well.  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  Painful lesions beneath the fourth metatarsal head and the fifth metatarsal head.  Plan: Discussed etiology pathology conservative surgical therapies at this point we discussed the need for surgical intervention which would consist of fifth metatarsal osteotomy with plantar flexion and straightening of the hammertoe deformity fifth right as well as a fourth metatarsal osteotomy with screws to help offload the painful keratoma on the plantar aspect of the foot.

## 2022-10-18 ENCOUNTER — Encounter: Payer: Self-pay | Admitting: Internal Medicine

## 2023-01-03 ENCOUNTER — Encounter: Payer: Self-pay | Admitting: Internal Medicine

## 2023-01-13 ENCOUNTER — Telehealth: Payer: Self-pay

## 2023-01-13 NOTE — Telephone Encounter (Signed)
Needs an appointment to discuss hypertension

## 2023-01-20 NOTE — Telephone Encounter (Signed)
Patient has been scheduled for a sooner appointment.  

## 2023-01-24 ENCOUNTER — Encounter: Payer: Self-pay | Admitting: Internal Medicine

## 2023-01-31 ENCOUNTER — Encounter: Payer: Self-pay | Admitting: Internal Medicine

## 2023-05-01 ENCOUNTER — Encounter: Payer: Self-pay | Admitting: Internal Medicine

## 2023-08-29 ENCOUNTER — Encounter: Payer: Self-pay | Admitting: Emergency Medicine

## 2023-08-29 ENCOUNTER — Ambulatory Visit (INDEPENDENT_AMBULATORY_CARE_PROVIDER_SITE_OTHER): Payer: Self-pay

## 2023-08-29 ENCOUNTER — Ambulatory Visit
Admission: EM | Admit: 2023-08-29 | Discharge: 2023-08-29 | Disposition: A | Discharge status: Home / Self Care | Payer: Self-pay | Attending: Internal Medicine | Admitting: Internal Medicine

## 2023-08-29 ENCOUNTER — Other Ambulatory Visit: Payer: Self-pay

## 2023-08-29 DIAGNOSIS — Z13220 Encounter for screening for lipoid disorders: Secondary | ICD-10-CM

## 2023-08-29 DIAGNOSIS — M79672 Pain in left foot: Secondary | ICD-10-CM

## 2023-08-29 DIAGNOSIS — M79671 Pain in right foot: Secondary | ICD-10-CM

## 2023-08-29 DIAGNOSIS — D649 Anemia, unspecified: Secondary | ICD-10-CM

## 2023-08-29 NOTE — ED Triage Notes (Signed)
Pt sts intermittent pain on bottom of left foot x 3 months; pt sts thinks could be neuropathy

## 2023-08-29 NOTE — ED Provider Notes (Signed)
[facility]    CSN: [id number] Arrival date & time: 08/29/23  0865      History   Chief Complaint Chief Complaint  Patient presents with   Foot Pain    HPI Adam Grimes is a 57 y.o. male.   Patient presents with left foot pain that started about 3 months ago and has been intermittent.  Patient reports that he recently went to a free clinic and the doctor told him that everything appeared normal.  Patient states that the pain is intermittent and is described as a sharp shooting pain that is intermittent.  Patient reports that the first time it happened it lasted approximately 1.5 minutes but is now only lasting about 3 seconds at a time.  He has taken ibuprofen 1 time for pain.  Denies any injury to the foot.  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  Denies numbness or tingling.   Foot Pain    Past Medical History:  Diagnosis Date   Condyloma    Internal hemorrhoid    Subluxation of right toe     Patient Active Problem List   Diagnosis Date Noted   Nodular goiter 07/14/2021   Elevated serum creatinine 07/14/2021   Dental decay 07/14/2021   Decreased visual acuity 04/19/2021   Hyperglycemia 04/19/2021   Anemia 04/19/2021   Irregular heartbeat 04/19/2021   Dysuria 04/19/2021   Abnormal ECG 04/19/2021   Right foot pain 12/02/2020   Cyst of perianal area 12/02/2020   Encounter to establish care 02/17/2016   Elevated BP 09/10/2014   Subluxation of toe 04/23/2012   Hip pain 04/23/2012   BUNIONETTE 07/25/2007   VENEREAL WART 07/10/2007   History of rectal bleeding 07/10/2007    Past Surgical History:  Procedure Laterality Date   plantar wart surgery Right 2019       Home Medications    Prior to Admission medications   Not on File    Family History Family History  Problem Relation Age of Onset   Depression Mother     Coronary artery disease Maternal Grandfather     Social History Social History   Tobacco Use   Smoking status: Never   Smokeless tobacco: Never  Vaping Use   Vaping status: Never Used  Substance Use Topics   Alcohol use: Yes    Alcohol/week: 0.0 standard drinks of alcohol    Comment: Rare   Drug use: Yes    Comment: Uses mj daily--less than last year.     Allergies   Patient has no known allergies.   Review of Systems Review of Systems Per HPI  Physical Exam Triage Vital Signs ED Triage Vitals [08/29/23 1039]  Encounter Vitals Group     BP (!) 159/97     Systolic BP Percentile      Diastolic BP Percentile      Pulse Rate (!) 50     Resp 18     Temp 97.8 F (36.6 C)     Temp Source Oral     SpO2 99 %     Weight      Height      Head Circumference      Peak Flow      Pain Score 0     Pain Loc      Pain Education      Exclude from [employer]  Chart    No data found.  Updated Vital Signs BP (!) 159/97 (BP Location: Left Arm)   Pulse (!) 50   Temp 97.8 F (36.6 C) (Oral)   Resp 18   SpO2 99%   Visual Acuity Right Eye Distance:   Left Eye Distance:   Bilateral Distance:    Right Eye Near:   Left Eye Near:    Bilateral Near:     Physical Exam Constitutional:      Appearance: Normal appearance.  HENT:     Head: Normocephalic and atraumatic.  Eyes:     Extraocular Movements: Extraocular movements intact.     Conjunctiva/sclera: Conjunctivae normal.  Pulmonary:     Effort: Pulmonary effort is normal.  Feet:     Comments: No obvious abnormality noted to the left foot.  No swelling, no discoloration, no lacerations, no abrasions.  Pedal pulses and capillary refill intact.  Patient has full range of motion of foot and is able to bear weight. Neurological:     General: No focal deficit present.     Mental Status: He is alert and oriented to person, place, and time. Mental status is at baseline.  Psychiatric:        Mood and Affect: Mood normal.         Behavior: Behavior normal.        Thought Content: Thought content normal.        Judgment: Judgment normal.      UC Treatments / Results  Labs (all labs ordered are listed, but only abnormal results are displayed) Labs Reviewed - No data to display  EKG   Radiology DG Foot Complete Left  Result Date: 08/29/2023 CLINICAL DATA:  Left foot pain.  No reported injury. EXAM: LEFT FOOT - COMPLETE 3+ VIEW COMPARISON:  09/13/2016.  No available report. FINDINGS: Moderate inferior and posterior calcaneal enthesophyte formation. Fragmented degenerative changes at the medial aspect of the 1st IP joint, with mild progression. Minimal 1st MTP joint degenerative changes. No acute fracture or dislocation. IMPRESSION: 1. No acute abnormality. 2. Moderate inferior and posterior calcaneal enthesophyte formation. 3. Fragmented degenerative changes at the medial aspect of the 1st IP joint, with mild progression. Electronically Signed   By: Beckie Salts M.D.   On: 08/29/2023 14:38    Procedures Procedures (including critical care time)  Medications Ordered in UC Medications - No data to display  Initial Impression / Assessment and Plan / UC Course  I have reviewed the triage vital signs and the nursing notes.  Pertinent labs & imaging results that were available during my care of the patient were reviewed by me and considered in my medical decision making (see chart for details).     X-ray of foot is showing degenerative changes and calcaneal spurs but no significant acute bony abnormality.  Suspect plantar fasciitis versus muscular strain versus tendinitis.  Discussed with patient good arch support shoes and following up with his established podiatrist for further evaluation and management.  Advised patient Tylenol would be safer as his most recent creatinine was slightly elevated.  Patient verbalized understanding and was agreeable with plan.  Called patient and discussed x-ray results. Final  Clinical Impressions(s) / UC Diagnoses   Final diagnoses:  Left foot pain     Discharge Instructions      I will call if x-ray results are abnormal.  Please follow-up with your podiatrist.    ED Prescriptions   None    PDMP not reviewed this encounter.  Gustavus Bryant, [place] 08/29/23 [phone]

## 2023-08-29 NOTE — Discharge Instructions (Signed)
I will call if x-ray results are abnormal.  Please follow-up with your podiatrist.

## 2023-08-30 LAB — CBC WITH DIFFERENTIAL/PLATELET
Basophils Absolute: 0 10*3/uL (ref 0.0–0.2)
Basos: 1 %
EOS (ABSOLUTE): 0.2 10*3/uL (ref 0.0–0.4)
Eos: 3 %
Hematocrit: 34.6 % — ABNORMAL LOW (ref 37.5–51.0)
Hemoglobin: 11.1 g/dL — ABNORMAL LOW (ref 13.0–17.7)
Immature Grans (Abs): 0 10*3/uL (ref 0.0–0.1)
Immature Granulocytes: 0 %
Lymphocytes Absolute: 2.1 10*3/uL (ref 0.7–3.1)
Lymphs: 40 %
MCH: 30.2 pg (ref 26.6–33.0)
MCHC: 32.1 g/dL (ref 31.5–35.7)
MCV: 94 fL (ref 79–97)
Monocytes Absolute: 0.4 10*3/uL (ref 0.1–0.9)
Monocytes: 8 %
Neutrophils Absolute: 2.6 10*3/uL (ref 1.4–7.0)
Neutrophils: 48 %
Platelets: 178 10*3/uL (ref 150–450)
RBC: 3.67 x10E6/uL — ABNORMAL LOW (ref 4.14–5.80)
RDW: 13 % (ref 11.6–15.4)
WBC: 5.4 10*3/uL (ref 3.4–10.8)

## 2023-08-30 LAB — LIPID PANEL W/O CHOL/HDL RATIO
Cholesterol, Total: 125 mg/dL (ref 100–199)
HDL: 59 mg/dL (ref >39)
LDL Chol Calc (NIH): 52 mg/dL (ref 0–99)
Triglycerides: 65 mg/dL (ref 0–149)
VLDL Cholesterol Cal: 14 mg/dL (ref 5–40)

## 2023-08-30 LAB — COMPREHENSIVE METABOLIC PANEL
ALT: 12 [IU]/L (ref 0–44)
AST: 26 [IU]/L (ref 0–40)
Albumin: 4.3 g/dL (ref 3.8–4.9)
Alkaline Phosphatase: 76 [IU]/L (ref 44–121)
BUN/Creatinine Ratio: 13 (ref 9–20)
BUN: 17 mg/dL (ref 6–24)
Bilirubin Total: 1.1 mg/dL (ref 0.0–1.2)
CO2: 26 mmol/L (ref 20–29)
Calcium: 9.1 mg/dL (ref 8.7–10.2)
Chloride: 105 mmol/L (ref 96–106)
Creatinine, Ser: 1.3 mg/dL — ABNORMAL HIGH (ref 0.76–1.27)
Globulin, Total: 1.9 g/dL (ref 1.5–4.5)
Glucose: 87 mg/dL (ref 70–99)
Potassium: 4.1 mmol/L (ref 3.5–5.2)
Sodium: 143 mmol/L (ref 134–144)
Total Protein: 6.2 g/dL (ref 6.0–8.5)
eGFR: 65 mL/min/{1.73_m2} (ref >59)

## 2023-08-30 LAB — VITAMIN B12: Vitamin B-12: 786 pg/mL (ref 232–1245)

## 2023-09-05 ENCOUNTER — Ambulatory Visit: Payer: Self-pay | Admitting: Internal Medicine

## 2023-09-05 VITALS — BP 120/80 | HR 48 | Resp 16 | Ht 69.5 in | Wt 186.0 lb

## 2023-09-05 DIAGNOSIS — D649 Anemia, unspecified: Secondary | ICD-10-CM

## 2023-09-05 DIAGNOSIS — M79672 Pain in left foot: Secondary | ICD-10-CM | POA: Insufficient documentation

## 2023-09-05 DIAGNOSIS — I10 Essential (primary) hypertension: Secondary | ICD-10-CM | POA: Insufficient documentation

## 2023-09-05 DIAGNOSIS — R7989 Other specified abnormal findings of blood chemistry: Secondary | ICD-10-CM

## 2023-09-05 DIAGNOSIS — K625 Hemorrhage of anus and rectum: Secondary | ICD-10-CM | POA: Insufficient documentation

## 2023-09-05 MED ORDER — LISINOPRIL 5 MG PO TABS: 5.0000 mg | ORAL_TABLET | Freq: Every day | ORAL | 11 refills | Status: AC

## 2023-09-05 NOTE — Progress Notes (Signed)
    Subjective:    Patient ID: Adam Grimes, male   DOB: 07-27-1966, 57 y.o.   MRN: [id number]   HPI  Left foot pain:  Overwhelming, shock-like burning pain in mid to distal arch, plantar foot. First episode was about 1 year ago when he was just standing still in kitchen. Lasted for 1 minute Second occurrence was in middle of night when sleeping.  Lasted 2-6 minutes.  Generally, however, does not occur when he is physically active--playing basketball, etc.    Over time, has become more frequent, but lasts for just 3 seconds and gone.   Occurs on average twice daily now.   He has an appt on the 15th with his podiatrist  2. Chronic anemia:  a bit more significant.  Long history of BRBPR.  No epigastric pain, but does state he feels abdominal discomfort before he has blood in the stool.  Cannot say whether mixed in or out outside of stool in toilet.  Is also on toilet paper.  Did not go to referral with GI back in 2021 for same issue.  Iron studies in 2021 were normal.  Note from many years ago with Dr. Luciana Axe stated internal hemorrhoids.    3.  Hypertension:  started on med 2 weeks ago [facility] [place].  He does not know what his BP was at time.  He has had intermittent elevations here, but always resolves to normal range.  Current Meds  Medication Sig   Cholecalciferol (VITAMIN D3) 50 MCG (2000 UT) CAPS Take 1 capsule by mouth daily.   Ferrous Sulfate (IRON) 325 (65 Fe) MG TABS Take 1 tablet by mouth every other day.   lisinopril (ZESTRIL) 5 MG tablet Take 5 mg by mouth daily.   No Known Allergies   Review of Systems    Objective:   BP 120/80 (BP Location: Left Arm, Patient Position: Sitting, Cuff Size: Normal)   Pulse (!) 48   Resp 16   Ht 5' 9.5" (1.765 m)   Wt 186 lb (84.4 kg)   BMI 27.07 kg/m   Physical Exam NAD HEENT:  PERRL, EOMI Neck:  Supple, No adenopathy Chest:  CTA CV:  RRR with normal S1 and S2, No S3, S4 or murmur.  No carotid bruits Carotid,  radial and DP pulses normal and equal LE:  No edema. Left foot:  jumps with pressure on plantar center mid arch area, but not clear if reproduces the pain. Perhaps a bit of thickening in the area of pain.  Assessment & Plan   Left Foot pain:  he has an upcoming appt with podiatry and discussed they would be best to assess he pain.  Consider Gabapentin if unable to ascertain cause.    2.  Anemia and BRBPR:  re refer to GI.  He promises to follow through with colonoscopy.  3.  Hypertension:  controlled.  Continue Lisinopril.  4.  Elevated Creatinine:  Have discussed in past.  UA when adequately hydrated normal.  Likely due to intermittent treatment of hypertension

## 2023-09-13 ENCOUNTER — Ambulatory Visit: Payer: No Typology Code available for payment source | Admitting: Podiatry

## 2023-09-22 ENCOUNTER — Encounter: Payer: Self-pay | Admitting: Internal Medicine

## 2023-10-12 ENCOUNTER — Ambulatory Visit: Payer: No Typology Code available for payment source | Admitting: Podiatry

## 2024-02-02 ENCOUNTER — Other Ambulatory Visit: Payer: Self-pay

## 2024-02-06 ENCOUNTER — Encounter: Payer: Self-pay | Admitting: Internal Medicine

## 2024-05-28 ENCOUNTER — Telehealth: Payer: Self-pay | Admitting: Internal Medicine

## 2024-05-28 NOTE — Telephone Encounter (Signed)
 Patient would like to get a lab work as patient would like to get labs done before a athlete trip.   Notified patient we are currently doing labs only on Fridays and patient stated he needed the lab done today or before Friday as he will be leaving Friday.   Patient decided not to do labs and just scheduled a follow up in October.

## 2024-08-13 ENCOUNTER — Ambulatory Visit: Payer: Self-pay | Admitting: Internal Medicine

## 2024-08-13 ENCOUNTER — Encounter: Payer: Self-pay | Admitting: Internal Medicine

## 2024-08-13 VITALS — BP 90/76 | HR 56 | Resp 18 | Ht 69.0 in | Wt 184.0 lb

## 2024-08-13 DIAGNOSIS — Z125 Encounter for screening for malignant neoplasm of prostate: Secondary | ICD-10-CM

## 2024-08-13 DIAGNOSIS — Z1211 Encounter for screening for malignant neoplasm of colon: Secondary | ICD-10-CM

## 2024-08-13 DIAGNOSIS — K921 Melena: Secondary | ICD-10-CM

## 2024-08-13 DIAGNOSIS — R739 Hyperglycemia, unspecified: Secondary | ICD-10-CM

## 2024-08-13 DIAGNOSIS — R7989 Other specified abnormal findings of blood chemistry: Secondary | ICD-10-CM

## 2024-08-13 DIAGNOSIS — R1013 Epigastric pain: Secondary | ICD-10-CM

## 2024-08-13 DIAGNOSIS — Z13220 Encounter for screening for lipoid disorders: Secondary | ICD-10-CM

## 2024-08-13 DIAGNOSIS — D649 Anemia, unspecified: Secondary | ICD-10-CM

## 2024-08-13 LAB — POCT URINALYSIS DIPSTICK
Bilirubin, UA: NEGATIVE
Glucose, UA: NEGATIVE
Ketones, UA: NEGATIVE
Leukocytes, UA: NEGATIVE
Nitrite, UA: NEGATIVE
Protein, UA: NEGATIVE
Spec Grav, UA: 1.02 (ref 1.010–1.025)
Urobilinogen, UA: 0.2 U/dL
pH, UA: 6 (ref 5.0–8.0)

## 2024-08-13 NOTE — Patient Instructions (Signed)
 Drink a glass of water before every meal Drink 6-8 glasses of water daily Eat three meals daily Eat a protein and healthy fat with every meal (eggs,fish, chicken, [place] and limit red meats) Eat 5 servings of vegetables daily, mix the colors Eat 2 servings of fruit daily with skin, if skin is edible Use smaller plates Put food/utensils down as you chew and swallow each bite Eat at a table with friends/family at least once daily, no TV Do not eat in front of the TV  Recent studies show that people who consume all of their calories in a 12 hour period lose weight more efficiently.  For example, if you eat your first meal at 7:00 a.m., your last meal of the day should be completed by 7:00 p.m.

## 2024-08-13 NOTE — Progress Notes (Signed)
 "   Subjective:    Patient ID: Adam Grimes, male   DOB: [birth year], 58 y.o.   MRN: [id number]   HPI Has not been seen in almost 1 year   History of hypertension:  was placed on Lisinopril  after BP found to be elevated end of 2024.  Took for max of 3 months.  Did not follow up as requested.  He is eating berries, grapes.  Eats first at 7:30 to 8 AM:  bowl of berries and grapes and 2 tsp of seamoss and black seed oil.  States Seamoss provide minerals through day.  Not clear what Black Seed oil.   Drinks water  11 AM:  same as in morning.  Water  6 PM:  Bag of Lays potato chips, tunafish mixed with olive oil and salt with saltine crackers  10 PM:  Salmon baked beans and mustard greens.  Eats other beans, both legumes and green beans.    Discussed adding more protein and good fat to each meal.  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    No fatigue and no light-headedness. .    2.  Elevated Creatinine:  has had elevated BP on and off in past.  Did finally come in for hydrated UA in past 2 years and UA was normal without protein.  Previously with + protein, but very concentrated urine.    3.  Anemia:  normocytic with normal iron studies, B12, and folate in past.  Never brought in FIT previously.  Went out of town when set up with GI in past as well.    4.  History of mild hyperglycemia in 2021, but A1C was 5.6%.     No outpatient medications have been marked as taking for the 08/13/24 encounter (Office Visit) with Adella Norris, MD.   No Known Allergies   Review of Systems    Objective:   BP 90/76 (BP Location: Left Arm, Patient Position: Sitting, Cuff Size: Normal)   Pulse (!) 56   Resp 18   Ht 5' 9 (1.753 m)   Wt 184 lb (83.5 kg)   BMI 27.17 kg/m   Physical Exam Constitutional:      Appearance: Normal appearance.  HENT:     Head: Normocephalic and atraumatic.     Right Ear: Tympanic membrane normal.     Left Ear:  Tympanic membrane normal.     Mouth/Throat:     Mouth: Mucous membranes are moist.     Pharynx: Oropharynx is clear.  Eyes:     Extraocular Movements: Extraocular movements intact.     Conjunctiva/sclera: Conjunctivae normal.     Pupils: Pupils are equal, round, and reactive to light.     Comments: Discs sharp  Cardiovascular:     Rate and Rhythm: Normal rate and regular rhythm.     Heart sounds: S1 normal and S2 normal. No murmur heard.    No friction rub. No S3 or S4 sounds.     Comments: No carotid bruits.  Carotid, radial, femoral, DP and PT pulses normal and equal.   Pulmonary:     Effort: Pulmonary effort is normal.     Breath sounds: Normal breath sounds and air entry.  Abdominal:     General: Abdomen is flat. Bowel sounds are normal.     Palpations: Abdomen is soft. There is no hepatomegaly, splenomegaly or mass.     Tenderness: There is no abdominal tenderness.     Hernia: No hernia  is present.  Musculoskeletal:     Cervical back: Normal range of motion and neck supple.     Right lower leg: No edema.     Left lower leg: No edema.  Skin:    General: Skin is warm.     Findings: No rash.  Neurological:     General: No focal deficit present.     Mental Status: He is alert and oriented to person, place, and time.  Psychiatric:        Mood and Affect: Mood normal.     Comments: Deflects frequently when trying to get him to discuss his health issues.        Assessment & Plan   Intermittent elevated BP, currently states not taking any BP medication and low normal.  He denies light headedness or fatigue with this.  No medication for now.    2.  Elevated creatinine:  states he is hydrated today--check UA and urine microalbumin/crea, CmP  3.  History of chronic anemia, worsening, last year along with epigastric pain, reported hematochezia.  Again, iron studies, B12 and folate were all okay. Went out of town when had visit with GI.  Will try to get him back in.    He is to  get FIT in ASAP. CBC.  4.  History of mild hyperglycemia with normal A1C.  Recheck A1C.    5.  HM:  PSA.  No interest in vaccines "

## 2024-08-14 ENCOUNTER — Other Ambulatory Visit: Payer: Self-pay

## 2024-08-14 LAB — POC FIT TEST STOOL: Fecal Occult Blood: POSITIVE

## 2024-08-15 LAB — CBC WITH DIFFERENTIAL/PLATELET
Basophils Absolute: 0.1 x10E3/uL (ref 0.0–0.2)
Basos: 2 %
EOS (ABSOLUTE): 0.3 x10E3/uL (ref 0.0–0.4)
Eos: 5 %
Hematocrit: 40.1 % (ref 37.5–51.0)
Hemoglobin: 13.2 g/dL (ref 13.0–17.7)
Immature Grans (Abs): 0 x10E3/uL (ref 0.0–0.1)
Immature Granulocytes: 0 %
Lymphocytes Absolute: 2.6 x10E3/uL (ref 0.7–3.1)
Lymphs: 39 %
MCH: 30.8 pg (ref 26.6–33.0)
MCHC: 32.9 g/dL (ref 31.5–35.7)
MCV: 94 fL (ref 79–97)
Monocytes Absolute: 0.5 x10E3/uL (ref 0.1–0.9)
Monocytes: 8 %
Neutrophils Absolute: 3.1 x10E3/uL (ref 1.4–7.0)
Neutrophils: 46 %
Platelets: 219 x10E3/uL (ref 150–450)
RBC: 4.28 x10E6/uL (ref 4.14–5.80)
RDW: 13.4 % (ref 11.6–15.4)
WBC: 6.6 x10E3/uL (ref 3.4–10.8)

## 2024-08-15 LAB — LIPID PANEL W/O CHOL/HDL RATIO
Cholesterol, Total: 169 mg/dL (ref 100–199)
HDL: 59 mg/dL (ref >39)
LDL Chol Calc (NIH): 91 mg/dL (ref 0–99)
Triglycerides: 108 mg/dL (ref 0–149)
VLDL Cholesterol Cal: 19 mg/dL (ref 5–40)

## 2024-08-15 LAB — MICROALBUMIN / CREATININE URINE RATIO
Creatinine, Urine: 270.9 mg/dL
Microalb/Creat Ratio: 2 mg/g{creat} (ref 0–29)
Microalbumin, Urine: 4.2 ug/mL

## 2024-08-15 LAB — HGB A1C W/O EAG

## 2024-08-15 LAB — PSA: Prostate Specific Ag, Serum: 0.2 ng/mL (ref 0.0–4.0)

## 2024-11-03 ENCOUNTER — Ambulatory Visit: Payer: Self-pay | Admitting: Internal Medicine

## 2024-11-04 NOTE — Progress Notes (Signed)
 The patient is notified about labs, and he is  scheduled for labs for 11/11/24.  I called [facility] but I could not get hold of any one.

## 2024-11-11 ENCOUNTER — Other Ambulatory Visit (INDEPENDENT_AMBULATORY_CARE_PROVIDER_SITE_OTHER): Payer: Self-pay

## 2024-11-11 DIAGNOSIS — I10 Essential (primary) hypertension: Secondary | ICD-10-CM

## 2024-11-11 DIAGNOSIS — R739 Hyperglycemia, unspecified: Secondary | ICD-10-CM

## 2024-11-12 LAB — HEMOGLOBIN A1C
Est. average glucose Bld gHb Est-mCnc: 114 mg/dL
Hgb A1c MFr Bld: 5.6 % (ref 4.8–5.6)

## 2024-11-12 LAB — COMPREHENSIVE METABOLIC PANEL WITH GFR
ALT: 16 IU/L (ref 0–44)
AST: 21 IU/L (ref 0–40)
Albumin: 4.6 g/dL (ref 3.8–4.9)
Alkaline Phosphatase: 99 IU/L (ref 47–123)
BUN/Creatinine Ratio: 11 (ref 9–20)
BUN: 14 mg/dL (ref 6–24)
Bilirubin Total: 0.6 mg/dL (ref 0.0–1.2)
CO2: 26 mmol/L (ref 20–29)
Calcium: 9.4 mg/dL (ref 8.7–10.2)
Chloride: 105 mmol/L (ref 96–106)
Creatinine, Ser: 1.24 mg/dL (ref 0.76–1.27)
Globulin, Total: 2.1 g/dL (ref 1.5–4.5)
Glucose: 94 mg/dL (ref 70–99)
Potassium: 4.5 mmol/L (ref 3.5–5.2)
Sodium: 144 mmol/L (ref 134–144)
Total Protein: 6.7 g/dL (ref 6.0–8.5)
eGFR: 68 mL/min/1.73

## 2024-12-06 ENCOUNTER — Ambulatory Visit: Payer: Self-pay | Admitting: Gastroenterology

## 2024-12-31 ENCOUNTER — Ambulatory Visit: Payer: Self-pay | Admitting: Gastroenterology

## 2025-02-11 ENCOUNTER — Other Ambulatory Visit: Payer: Self-pay

## 2025-02-12 ENCOUNTER — Ambulatory Visit: Payer: Self-pay | Admitting: Internal Medicine

## 2025-08-14 ENCOUNTER — Other Ambulatory Visit: Payer: Self-pay | Admitting: Internal Medicine

## 2025-08-19 ENCOUNTER — Encounter: Payer: Self-pay | Admitting: Internal Medicine
